# Patient Record
Sex: Female | Born: 1960
Health system: Southern US, Community
[De-identification: ages and names within clinical notes are randomized; demographics above are authoritative.]

## PROBLEM LIST (undated history)

## (undated) DIAGNOSIS — M7989 Other specified soft tissue disorders: Secondary | ICD-10-CM

## (undated) DIAGNOSIS — I10 Essential (primary) hypertension: Secondary | ICD-10-CM

## (undated) DIAGNOSIS — M2550 Pain in unspecified joint: Secondary | ICD-10-CM

## (undated) DIAGNOSIS — L719 Rosacea, unspecified: Secondary | ICD-10-CM

## (undated) DIAGNOSIS — G2581 Restless legs syndrome: Secondary | ICD-10-CM

## (undated) DIAGNOSIS — E039 Hypothyroidism, unspecified: Secondary | ICD-10-CM

## (undated) DIAGNOSIS — I83893 Varicose veins of bilateral lower extremities with other complications: Secondary | ICD-10-CM

## (undated) DIAGNOSIS — D649 Anemia, unspecified: Secondary | ICD-10-CM

## (undated) DIAGNOSIS — E538 Deficiency of other specified B group vitamins: Secondary | ICD-10-CM

## (undated) DIAGNOSIS — G4730 Sleep apnea, unspecified: Secondary | ICD-10-CM

## (undated) DIAGNOSIS — E559 Vitamin D deficiency, unspecified: Secondary | ICD-10-CM

## (undated) DIAGNOSIS — R0602 Shortness of breath: Secondary | ICD-10-CM

## (undated) DIAGNOSIS — K829 Disease of gallbladder, unspecified: Secondary | ICD-10-CM

## (undated) HISTORY — DX: Deficiency of other specified B group vitamins: E53.8

## (undated) HISTORY — DX: Restless legs syndrome: G25.81

## (undated) HISTORY — DX: Hypothyroidism, unspecified: E03.9

## (undated) HISTORY — DX: Morbid (severe) obesity due to excess calories: E66.01

## (undated) HISTORY — DX: Sleep apnea, unspecified: G47.30

## (undated) HISTORY — DX: Vitamin D deficiency, unspecified: E55.9

## (undated) HISTORY — DX: Other specified soft tissue disorders: M79.89

## (undated) HISTORY — DX: Rosacea, unspecified: L71.9

## (undated) HISTORY — PX: LASIK: SHX215

## (undated) HISTORY — DX: Disease of gallbladder, unspecified: K82.9

## (undated) HISTORY — DX: Essential (primary) hypertension: I10

## (undated) HISTORY — PX: CARPAL TUNNEL RELEASE: SHX101

## (undated) HISTORY — DX: Pain in unspecified joint: M25.50

## (undated) HISTORY — DX: Varicose veins of bilateral lower extremities with other complications: I83.893

## (undated) HISTORY — DX: Shortness of breath: R06.02

## (undated) HISTORY — DX: Anemia, unspecified: D64.9

## (undated) HISTORY — PX: EYE SURGERY: SHX253

---

## 2011-12-23 HISTORY — PX: GASTRIC BYPASS: SHX52

## 2014-12-22 HISTORY — PX: CHOLECYSTECTOMY: SHX55

## 2018-10-29 ENCOUNTER — Telehealth: Payer: Self-pay | Admitting: Family

## 2018-10-29 ENCOUNTER — Ambulatory Visit: Payer: Federal, State, Local not specified - PPO | Admitting: Family

## 2018-10-29 ENCOUNTER — Encounter: Payer: Self-pay | Admitting: Family

## 2018-10-29 VITALS — BP 118/74 | HR 61 | Temp 98.5°F | Ht 67.0 in | Wt 270.6 lb

## 2018-10-29 DIAGNOSIS — Z1159 Encounter for screening for other viral diseases: Secondary | ICD-10-CM

## 2018-10-29 DIAGNOSIS — G4733 Obstructive sleep apnea (adult) (pediatric): Secondary | ICD-10-CM

## 2018-10-29 DIAGNOSIS — G2581 Restless legs syndrome: Secondary | ICD-10-CM | POA: Insufficient documentation

## 2018-10-29 DIAGNOSIS — Z9884 Bariatric surgery status: Secondary | ICD-10-CM | POA: Insufficient documentation

## 2018-10-29 DIAGNOSIS — E538 Deficiency of other specified B group vitamins: Secondary | ICD-10-CM | POA: Insufficient documentation

## 2018-10-29 DIAGNOSIS — E559 Vitamin D deficiency, unspecified: Secondary | ICD-10-CM | POA: Insufficient documentation

## 2018-10-29 DIAGNOSIS — Z23 Encounter for immunization: Secondary | ICD-10-CM

## 2018-10-29 DIAGNOSIS — E039 Hypothyroidism, unspecified: Secondary | ICD-10-CM | POA: Diagnosis not present

## 2018-10-29 DIAGNOSIS — L719 Rosacea, unspecified: Secondary | ICD-10-CM | POA: Diagnosis not present

## 2018-10-29 DIAGNOSIS — D509 Iron deficiency anemia, unspecified: Secondary | ICD-10-CM | POA: Diagnosis not present

## 2018-10-29 DIAGNOSIS — I83813 Varicose veins of bilateral lower extremities with pain: Secondary | ICD-10-CM | POA: Insufficient documentation

## 2018-10-29 MED ORDER — ROPINIROLE HCL 1 MG PO TABS: 1.5000 mg | ORAL_TABLET | Freq: Every day | ORAL | 2 refills | Status: DC

## 2018-10-29 MED ORDER — METRONIDAZOLE 0.75 % EX GEL: 1.0000 "application " | Freq: Two times a day (BID) | CUTANEOUS | 4 refills | Status: DC

## 2018-10-29 MED ORDER — LEVOTHYROXINE SODIUM 25 MCG PO TABS: ORAL_TABLET | ORAL | 2 refills | Status: DC

## 2018-10-29 MED ORDER — LEVOTHYROXINE SODIUM 200 MCG PO TABS: ORAL_TABLET | ORAL | 1 refills | Status: DC

## 2018-10-29 NOTE — Telephone Encounter (Signed)
Thigh high will have the correct compression.  Full leg has less compression.  Advised [facility] in [place] to do thigh high.  Message left for patient on voice mail.

## 2018-10-29 NOTE — Telephone Encounter (Signed)
PT has called states that the rx she was given for compression stockings she took to [facility] and they need to know if Thigh high or full leg and what compression?   [facility]  Fax: [phone]

## 2018-10-29 NOTE — Telephone Encounter (Signed)
Please advise 

## 2018-10-29 NOTE — Progress Notes (Signed)
Subjective:    Patient ID: Stacy Salazar, female    DOB: 1961-11-04, 57 y.o.   MRN: [id number]  Chief Complaint  Patient presents with  . New Patient (Initial Visit)   PT presents to the office today to establish care. She states she just moved here from [place]. PT has a hx of gastric bypass in 2013. States she has gained lot of her weight back, but over the last two weeks has started the Keto diet and has lost 5 lbs.   She is also complaining of varicose veins bilaterally with pain. She states she has had these for years, but over the last month she has noticed an intermittent aching pain of 5 out 10.  Thyroid Problem  Presents for follow-up visit. Symptoms include dry skin, fatigue and weight gain. Patient reports no constipation, diarrhea or palpitations. The symptoms have been stable.  Anemia  Presents for follow-up visit. Symptoms include malaise/fatigue. There has been no anorexia, bruising/bleeding easily, leg swelling, palpitations or paresthesias.  RLS Pt currently taking Requip 1.5 mg nightly. States it was working well, but noticed her legs are slightly worse. Admits her caffeine intake has also picked up.  OSA Uses CPAP every night. Stable Rosacea  Uses metrogel as needed.    Review of Systems  Constitutional: Positive for fatigue, malaise/fatigue and weight gain.  Cardiovascular: Negative for palpitations.  Gastrointestinal: Negative for anorexia, constipation and diarrhea.  Neurological: Negative for paresthesias.  Hematological: Does not bruise/bleed easily.  All other systems reviewed and are negative.  Family History  Problem Relation Age of Onset  . Alcohol abuse Mother   . Cancer Mother   . Cirrhosis Mother   . Heart disease Father    Social History   Socioeconomic History  . Marital status: Divorced    Spouse name: Not on file  . Number of children: Not on file  . Years of education: Not on file  . Highest education level: Not on file  Occupational  History  . Not on file  Social Needs  . Financial resource strain: Not on file  . Food insecurity:    Worry: Not on file    Inability: Not on file  . Transportation needs:    Medical: Not on file    Non-medical: Not on file  Tobacco Use  . Smoking status: Never Smoker  . Smokeless tobacco: Never Used  Substance and Sexual Activity  . Alcohol use: Not Currently    Comment: Glass of wine monthly  . Drug use: Never  . Sexual activity: Not Currently  Lifestyle  . Physical activity:    Days per week: Not on file    Minutes per session: Not on file  . Stress: Not on file  Relationships  . Social connections:    Talks on phone: Not on file    Gets together: Not on file    Attends religious service: Not on file    Active member of club or organization: Not on file    Attends meetings of clubs or organizations: Not on file    Relationship status: Not on file  Other Topics Concern  . Not on file  Social History Narrative  . Not on file       Objective:   Physical Exam  Constitutional: She is oriented to person, place, and time. She appears well-developed and well-nourished. No distress.  Morbid obese  HENT:  Head: Normocephalic and atraumatic.  Right Ear: External ear normal.  Left Ear: External ear  normal.  Mouth/Throat: Oropharynx is clear and moist.  Eyes: Pupils are equal, round, and reactive to light.  Neck: Normal range of motion. Neck supple. No thyromegaly present.  Cardiovascular: Normal rate, regular rhythm, normal heart sounds and intact distal pulses.  No murmur heard. Pulmonary/Chest: Effort normal and breath sounds normal. No respiratory distress. She has no wheezes.  Abdominal: Soft. Bowel sounds are normal. She exhibits no distension. There is no tenderness.  Musculoskeletal: Normal range of motion. She exhibits no edema or tenderness.  Neurological: She is alert and oriented to person, place, and time. She has normal reflexes. No cranial nerve deficit.    Skin: Skin is warm and dry.     varicose veins present   Psychiatric: She has a normal mood and affect. Her behavior is normal. Judgment and thought content normal.  Vitals reviewed.     BP 118/74   Pulse 61   Temp 98.5 F (36.9 C) (Oral)   Ht '5\' 7"'  (1.702 m)   Wt 270 lb 9.6 oz (122.7 kg)   BMI 42.38 kg/m      Assessment & Plan:  Stacy Salazar comes in today with chief complaint of New Patient (Initial Visit)   Diagnosis and orders addressed:  1. Hypothyroidism, unspecified type - CMP14+EGFR - TSH - levothyroxine (SYNTHROID, LEVOTHROID) 200 MCG tablet; TAKE 1 TABLET BY MOUTH ONCE DAILY IN THE MORNING ON AN EMPTY STOMACH  Dispense: 90 tablet; Refill: 1 - levothyroxine (SYNTHROID, LEVOTHROID) 25 MCG tablet; TAKE 1 TABLET BY MOUTH ONCE DAILY BEFORE BREAKFAST  Dispense: 90 tablet; Refill: 2  2. Iron deficiency anemia, unspecified iron deficiency anemia type - Ferrous Sulfate (IRON) 325 (65 Fe) MG TABS; Take by mouth. - CMP14+EGFR - Anemia Profile B  3. Rosacea Avoid extreme hot or cold temps Alcohol  - CMP14+EGFR - metroNIDAZOLE (METROGEL) 0.75 % gel; Apply 1 application topically 2 (two) times daily.  Dispense: 45 g; Refill: 4  4. Hx of gastric bypass - CMP14+EGFR - Lipid panel - VITAMIN D 25 Hydroxy (Vit-D Deficiency, Fractures)  5. Restless leg syndrome - CMP14+EGFR - rOPINIRole (REQUIP) 1 MG tablet; Take 1.5 tablets (1.5 mg total) by mouth at bedtime.  Dispense: 135 tablet; Refill: 2  6. Morbid obesity ([place]) - CMP14+EGFR - Lipid panel  7. OSA (obstructive sleep apnea) - CMP14+EGFR - Lipid panel  8. Varicose veins of bilateral lower extremities with pain - CMP14+EGFR - Lipid panel - Compression stockings  9. Need for hepatitis C screening test - CMP14+EGFR - Hepatitis C antibody  10. Vitamin B 12 deficiency - CALCIUM CITRATE PO; Take 600 mg by mouth 3 (three) times daily. - CMP14+EGFR - Anemia Profile B  11. Vitamin D deficiency -  Cholecalciferol (VITAMIN D) 50 MCG (2000 UT) CAPS; Take by mouth. - CMP14+EGFR - VITAMIN D 25 Hydroxy (Vit-D Deficiency, Fractures)   Labs pending Health Maintenance reviewed- Mammogram and Colonoscopy completed and will get copy scanned into chart Diet and exercise encouraged  Follow up plan: 6 months    [name], FNP

## 2018-10-29 NOTE — Telephone Encounter (Signed)
15-20 mmHg, and please give full leg. Thanks

## 2018-10-29 NOTE — Patient Instructions (Addendum)
Rosacea Rosacea is a long-term (chronic) condition that affects the skin of the face, including the cheeks, nose, brow, and chin. This condition can also affect the eyes. Rosacea causes blood vessels near the surface of the skin to enlarge, which results in redness. What are the causes? The cause of this condition is not known. Certain triggers can make rosacea worse, including:  Hot baths.  Exercise.  Sunlight.  Very hot or cold temperatures.  Hot or spicy foods and drinks.  Drinking alcohol.  Stress.  Taking blood pressure medicine.  Long-term use of topical steroids on the face.  What increases the risk? This condition is more likely to develop in:  People who are older than 57 years of age.  Women.  People who have light-colored skin (light complexion).  People who have a family history of rosacea.  What are the signs or symptoms? Symptoms of this condition include:  Redness of the face.  Red bumps or pimples on the face.  A red, enlarged nose.  Blushing easily.  Red lines on the skin.  Irritated or burning feeling in the eyes.  Swollen eyelids.  How is this diagnosed? This condition is diagnosed with a medical history and physical exam. How is this treated? There is no cure for this condition, but treatment can help to control your symptoms. Your health care provider may recommend that you see a skin specialist (dermatologist). Treatment may include:  Antibiotic medicines that are applied to the skin or taken as a pill.  Laser treatment to improve the appearance of the skin.  Surgery. This is rare.  Your health care provider will also recommend the best way to take care of your skin. Even after your skin improves, you will likely need to continue treatment to prevent your rosacea from coming back. Follow these instructions at home: Skin Care Take care of your skin as told by your health care provider. You may be told to do these things:  Wash  your skin gently two or more times each day.  Use mild soap.  Use a sunscreen or sunblock with SPF 30 or greater.  Use gentle cosmetics that are meant for sensitive skin.  Shave with an electric shaver instead of a blade.  Lifestyle  Try to keep track of what foods trigger this condition. Avoid any triggers. These may include: ? Spicy foods. ? Seafood. ? Cheese. ? Hot liquids. ? Nuts. ? Chocolate. ? Iodized salt.  Do not drink alcohol.  Avoid extremely cold or hot temperatures.  Try to reduce your stress. If you need help, talk with your health care provider.  When you exercise, do these things to stay cool: ? Limit your sun exposure. ? Use a fan. ? Do shorter and more frequent intervals of exercise. General instructions  Keep all follow-up visits as told by your health care provider. This is important.  Take over-the-counter and prescription medicines only as told by your health care provider.  If your eyelids are affected, apply warm compresses to them. Do this as told by your health care provider.  If you were prescribed an antibiotic medicine, apply or take it as told by your health care provider. Do not stop using the antibiotic even if your condition improves. Contact a health care provider if:  Your symptoms get worse.  Your symptoms do not improve after two months of treatment.  You have new symptoms.  You have any changes in vision or you have problems with your eyes, such as   redness or itching.  You feel depressed.  You lose your appetite.  You have trouble concentrating. This information is not intended to replace advice given to you by your health care provider. Make sure you discuss any questions you have with your health care provider. Document Released: 01/15/2005 Document Revised: 05/15/2016 Document Reviewed: 02/14/2015 Elsevier Interactive Patient Education  2018 Elsevier Inc. Restless Legs Syndrome Restless legs syndrome is a condition  that causes uncomfortable feelings or sensations in the legs, especially while sitting or lying down. The sensations usually cause an overwhelming urge to move the legs. The arms can also sometimes be affected. The condition can range from mild to severe. The symptoms often interfere with a person's ability to sleep. What are the causes? The cause of this condition is not known. What increases the risk? This condition is more likely to develop in:  People who are older than age 66.  Pregnant women. In general, restless legs syndrome is more common in women than in men.  People who have a family history of the condition.  People who have certain medical conditions, such as iron deficiency, kidney disease, Parkinson disease, or nerve damage.  People who take certain medicines, such as medicines for high blood pressure, nausea, colds, allergies, depression, and some heart conditions.  What are the signs or symptoms? The main symptom of this condition is uncomfortable sensations in the legs. These sensations may be:  Described as pulling, tingling, prickling, throbbing, crawling, or burning.  Worse while you are sitting or lying down.  Worse during periods of rest or inactivity.  Worse at night, often interfering with your sleep.  Accompanied by a very strong urge to move your legs.  Temporarily relieved by movement of your legs.  The sensations usually affect both sides of the body. The arms can also be affected, but this is rare. People who have this condition often have tiredness during the day because of their lack of sleep at night. How is this diagnosed? This condition may be diagnosed based on your description of the symptoms. You may also have tests, including blood tests, to check for other conditions that may lead to your symptoms. In some cases, you may be asked to spend some time in a sleep lab so your sleeping can be monitored. How is this treated? Treatment for this  condition is focused on managing the symptoms. Treatment may include:  Self-help and lifestyle changes.  Medicines.  Follow these instructions at home:  Take medicines only as directed by your health care provider.  Try these methods to get temporary relief from the uncomfortable sensations: ? Massage your legs. ? Walk or stretch. ? Take a cold or hot bath.  Practice good sleep habits. For example, go to bed and get up at the same time every day.  Exercise regularly.  Practice ways of relaxing, such as yoga or meditation.  Avoid caffeine and alcohol.  Do not use any tobacco products, including cigarettes, chewing tobacco, or electronic cigarettes. If you need help quitting, ask your health care provider.  Keep all follow-up visits as directed by your health care provider. This is important. Contact a health care provider if: Your symptoms do not improve with treatment, or they get worse. This information is not intended to replace advice given to you by your health care provider. Make sure you discuss any questions you have with your health care provider. Document Released: 11/28/2002 Document Revised: 05/15/2016 Document Reviewed: 12/04/2014 Elsevier Interactive Patient Education  2018  [employer].

## 2018-10-30 LAB — CMP14+EGFR
ALT: 17 IU/L (ref 0–32)
AST: 18 IU/L (ref 0–40)
Albumin/Globulin Ratio: 1.6 (ref 1.2–2.2)
Albumin: 4.3 g/dL (ref 3.5–5.5)
Alkaline Phosphatase: 83 IU/L (ref 39–117)
BUN/Creatinine Ratio: 25 — ABNORMAL HIGH (ref 9–23)
BUN: 15 mg/dL (ref 6–24)
Bilirubin Total: 0.3 mg/dL (ref 0.0–1.2)
CALCIUM: 9.6 mg/dL (ref 8.7–10.2)
CO2: 25 mmol/L (ref 20–29)
Chloride: 102 mmol/L (ref 96–106)
Creatinine, Ser: 0.61 mg/dL (ref 0.57–1.00)
GFR calc Af Amer: 116 mL/min/{1.73_m2} (ref >59)
GFR, EST NON AFRICAN AMERICAN: 101 mL/min/{1.73_m2} (ref >59)
Globulin, Total: 2.7 g/dL (ref 1.5–4.5)
Glucose: 89 mg/dL (ref 65–99)
POTASSIUM: 4.9 mmol/L (ref 3.5–5.2)
SODIUM: 141 mmol/L (ref 134–144)
TOTAL PROTEIN: 7 g/dL (ref 6.0–8.5)

## 2018-10-30 LAB — ANEMIA PROFILE B
BASOS ABS: 0 10*3/uL (ref 0.0–0.2)
Basos: 1 %
EOS (ABSOLUTE): 0.1 10*3/uL (ref 0.0–0.4)
Eos: 2 %
Ferritin: 96 ng/mL (ref 15–150)
Folate: >20 ng/mL (ref >3.0)
HEMATOCRIT: 36 % (ref 34.0–46.6)
Hemoglobin: 12.5 g/dL (ref 11.1–15.9)
IMMATURE GRANS (ABS): 0 10*3/uL (ref 0.0–0.1)
IMMATURE GRANULOCYTES: 0 %
Iron Saturation: 33 % (ref 15–55)
Iron: 103 ug/dL (ref 27–159)
LYMPHS: 42 %
Lymphocytes Absolute: 2.5 10*3/uL (ref 0.7–3.1)
MCH: 30.6 pg (ref 26.6–33.0)
MCHC: 34.7 g/dL (ref 31.5–35.7)
MCV: 88 fL (ref 79–97)
MONOCYTES: 7 %
Monocytes Absolute: 0.4 10*3/uL (ref 0.1–0.9)
NEUTROS PCT: 48 %
Neutrophils Absolute: 2.9 10*3/uL (ref 1.4–7.0)
Platelets: 301 10*3/uL (ref 150–450)
RBC: 4.08 x10E6/uL (ref 3.77–5.28)
RDW: 12.5 % (ref 12.3–15.4)
RETIC CT PCT: 1.3 % (ref 0.6–2.6)
Total Iron Binding Capacity: 309 ug/dL (ref 250–450)
UIBC: 206 ug/dL (ref 131–425)
Vitamin B-12: >2000 pg/mL — ABNORMAL HIGH (ref 232–1245)
WBC: 6 10*3/uL (ref 3.4–10.8)

## 2018-10-30 LAB — HEPATITIS C ANTIBODY

## 2018-10-30 LAB — LIPID PANEL
CHOL/HDL RATIO: 3.3 ratio (ref 0.0–4.4)
Cholesterol, Total: 173 mg/dL (ref 100–199)
HDL: 53 mg/dL (ref >39)
LDL Calculated: 82 mg/dL (ref 0–99)
TRIGLYCERIDES: 192 mg/dL — AB (ref 0–149)
VLDL Cholesterol Cal: 38 mg/dL (ref 5–40)

## 2018-10-30 LAB — TSH: TSH: 2.45 u[IU]/mL (ref 0.450–4.500)

## 2018-10-30 LAB — VITAMIN D 25 HYDROXY (VIT D DEFICIENCY, FRACTURES): VIT D 25 HYDROXY: 31.7 ng/mL (ref 30.0–100.0)

## 2018-12-30 ENCOUNTER — Other Ambulatory Visit: Payer: Self-pay | Admitting: Family

## 2018-12-30 DIAGNOSIS — Z9884 Bariatric surgery status: Secondary | ICD-10-CM

## 2019-01-10 ENCOUNTER — Encounter: Payer: Federal, State, Local not specified - PPO | Attending: Family | Admitting: Skilled Nursing Facility1

## 2019-01-10 ENCOUNTER — Encounter: Payer: Self-pay | Admitting: Skilled Nursing Facility1

## 2019-01-10 NOTE — Progress Notes (Signed)
Follow-up visit: Post-Operative RYGB 2013 Surgery  Primary concerns today: Post-operative Bariatric Surgery Nutrition Management.   Pt states she lost 100 pounds down to 237 pounds and states she could not lose any more weight stating she has gained 40 pounds and has tried several different diets since then. Pt states she has retired from the [employer]. Pt states her only concern is to lose weight. Pt states she has felt tired and sluggish. Pt state she joined [employer]. Pt states she wears her C-PAP every night.  Pt arrives with very thin hair (stating that is her normal) and red splotches on her face (stating she has roscea).  Pt states she has been struggling with forgetfulness.   TANITA  BODY COMP RESULTS  01/10/2019   BMI (kg/m^2) 43.4   Fat Mass (lbs) 136.2   Fat Free Mass (lbs) 140.6   Total Body Water (lbs) 103.2/37.3%   24-hr recall: B (AM): 1 cup of oatmeal with 1/2 cup blueberries and 1/4 walnuts and 2 scoops of vanilla greek yogurt  Snk (AM):  1/2 cup cottage cheese and sometimes with cucumber or blueberries  L (PM): cabbage soup with ground [place]key  Snk (PM): cottage cheese with cucumber or blueberries  D (PM): pork or chicken with lima beans or brussels sprouts of green beans  Snk (PM): toast and peanutbutter   Fluid intake: 3 cups (24 ounces) of coffee with truvia and 2% milk, 24 ounces decaf green tea with truvia, 16 ounces sugar free ice tea mix with water Sometimes 3-4 times a week 5 ounces of red wine Estimated total protein intake: 80+  Medications: see list Supplementation: calcium citrate 2 times a day and vitmain b12 once a day 1000mcg, centrum silver   Using straws: no Drinking while eating: yes Having you been chewing well: yes Chewing/swallowing difficulties: no Changes in vision: no Changes to mood/headaches: no Hair loss/Changes to skin/Changes to nails: no Any difficulty focusing or concentrating: yes Sweating: no Dizziness/Lightheaded:  no Palpitations: no Carbonated beverages: no N/V/D/C/GAS: no Abdominal Pain: no Dumping syndrome: no  Recent physical activity:  Walking 6 days a week 20-30 minutes; 30 minutes on the treadmill and 30 minutes doing weight 3 days a week   Progress Towards Goal(s):  In progress.    Intervention:  Nutrition cousneling. Due to the bodies need for essential vitamins, minerals, and fats the pt was educated on the need to consume a certain amount of calories as well as certain nutrients daily. Pt was educated on the need for daily physical activity and to reach a goal of at least 150 minutes of moderate to vigorous physical activity as directed by their physician due to such benefits as increased musculature and improved lab values.   Goals: -Stop taking the vitamin B12 supplement  -Start taking the appropriate multivitamin -Log every thing you eat and drink for the next month  -Do not use butter to cook with    -Do not use cream cheese, butter, sugar, honey, etc.  -Cut your coffee down to 2 cups of caffienated a day -Aim for a minimum of 64 ounces of decaff green tea and unsweet tea    Teaching Method Utilized:  Visual Auditory Hands on  Barriers to learning/adherence to lifestyle change: none stated   Demonstrated degree of understanding via:  Teach Back   Monitoring/Evaluation:  Dietary intake, exercise, and body weight. Follow up in 1 months

## 2019-01-10 NOTE — Patient Instructions (Addendum)
-  Stop taking the vitamin B12 supplement   -Start taking the appropriate multivitamin  -Log every thing you eat and drink for the next month   -Do not use butter to cook with     -Do not use cream cheese, butter, sugar, honey, etc.   -Cut your coffee down to 2 cups of caffienated a day  -Aim for a minimum of 64 ounces of decaff green tea and unsweet tea

## 2019-01-11 ENCOUNTER — Telehealth: Payer: Self-pay | Admitting: Skilled Nursing Facility1

## 2019-01-11 NOTE — Telephone Encounter (Signed)
1) Yes 2) It is possible to overconsume biotin so I do not recommend an extra supplement of it. The appropriate multivitamin we went over does have the adequate amount of biotin in it.  Hope this helps!  From: [email or web address]  Sent: Monday, January 10, 2019 9:42 PM To: Scotece, [email or web address]> Subject: [External Email]Yesterdays Appointment (01/08/2019) with Stacy Salazar  *Caution - External email - see footer for warnings* Good Morning Stacy Salazar  This is Stacy Salazar. I was in for an appointment yesterday at 815 AM for bariatric/nutrition questions.  Two questions I forgot to ask you were:       1) If I drink decaf coffee can I add that amount into my 64 oz of fluid along with the decaf tea and unsweet tea ?       2) What is your recommendation about Hair, Skin, and Nails with Biotin for my thinning hair ?  In your experience, do you feel  these actually work or is this just another hype ?  Thank you for all your help yesterday !   Stacy Salazar WARNING: This email originated outside of [facility]. Even if this looks like a [employer], it is not. Do not provide your username, password, or any other personal information in response to this or any other email. [facility] will never ask you for your username or password via email. DO NOT CLICK links or attachments unless you are positive the content is safe. If in doubt about the safety of this message, select the [employer] button, which forwards to IT Security.

## 2019-02-08 ENCOUNTER — Ambulatory Visit: Payer: Federal, State, Local not specified - PPO | Admitting: Family

## 2019-02-10 ENCOUNTER — Encounter: Payer: Federal, State, Local not specified - PPO | Attending: Family | Admitting: Skilled Nursing Facility1

## 2019-02-10 NOTE — Patient Instructions (Addendum)
-  Do not weigh every day; only weigh once a week  -Great Job!!!  -Aim for 80 ounces of fluid a day  -Take your multivitamin and calcium at least 2 hours a part

## 2019-02-10 NOTE — Progress Notes (Signed)
Follow-up visit: Post-Operative RYGB 2013 Surgery  Primary concerns today: Post-operative Bariatric Surgery Nutrition Management.  Pt states she wears her C-PAP every night.  Pt arrives with very thin hair (stating that is her normal) and red splotches on her face (stating she has roscea).  Pt states she has been struggling with forgetfulness.   Pt states her goals have been to workout 3 times a week and walking every day and feels she has achieved that goal. Pt states when she went out of town she brought snacks with per. Pt state she feels better since her last visits with more energy.   Weight: 268 pounds Weight change: -8 pounds   TANITA  BODY COMP RESULTS  01/10/2019 02/10/2019   BMI (kg/m^2) 43.4 42   Fat Mass (lbs) 136.2 130.8   Fat Free Mass (lbs) 140.6 137.2   Total Body Water (lbs) 103.2/37.3% 100.6   24-hr recall: B (AM): 2 eggs, 1 slice cheese, onion, 1 slice bread with 1 tsp peanuttbutter  Snk (AM):  Low fat greek yogurt and strawberries L (PM): half cucumber, 6 radishes, roasted cauliflower, 1 egg, red pepper, and pinto beans Snk (PM): walnuts and 1 slice of bread  D (PM): beef stew and pickeled beets  Snk (PM): toast and peanutbutter   Fluid intake: 3 cups (24 ounces) of coffee with truvia and 2% milk, 24 ounces decaf green tea with truvia, 16 ounces sugar free ice tea mix with water: 64+ fluid ounces  Sometimes 3-4 times a week 5 ounces of red wine Estimated total protein intake: 80+  Medications: see list Supplementation: bar advantage 2 and calcium   Using straws: no Drinking while eating: yes Having you been chewing well: yes Chewing/swallowing difficulties: no Changes in vision: no Changes to mood/headaches: no Hair loss/Changes to skin/Changes to nails: no Any difficulty focusing or concentrating: yes Sweating: no Dizziness/Lightheaded: no Palpitations: no Carbonated beverages: no N/V/D/C/GAS: no Abdominal Pain: no Dumping syndrome: no  Recent  physical activity:  Walking 6 days a week 20-30 minutes; 30 minutes on the treadmill and 30 minutes doing weight 3 days a week   Progress Towards Goal(s):  In progress.    Intervention:  Nutrition cousneling. Due to the bodies need for essential vitamins, minerals, and fats the pt was educated on the need to consume a certain amount of calories as well as certain nutrients daily. Pt was educated on the need for daily physical activity and to reach a goal of at least 150 minutes of moderate to vigorous physical activity as directed by their physician due to such benefits as increased musculature and improved lab values.   Goals: -Do not weigh every day; only weigh once a week -Great Job!!! -Aim for 80 ounces of fluid a day -Take your multivitamin and calcium at least 2 hours a part   Teaching Method Utilized:  Visual Auditory Hands on  Barriers to learning/adherence to lifestyle change: none stated   Demonstrated degree of understanding via:  Teach Back   Monitoring/Evaluation:  Dietary intake, exercise, and body weight. Follow up in 1 months

## 2019-02-11 ENCOUNTER — Encounter: Payer: Self-pay | Admitting: Family

## 2019-02-11 ENCOUNTER — Ambulatory Visit: Payer: Federal, State, Local not specified - PPO | Admitting: Family

## 2019-02-11 VITALS — BP 130/77 | HR 61 | Temp 97.9°F | Ht 67.0 in | Wt 269.6 lb

## 2019-02-11 DIAGNOSIS — L719 Rosacea, unspecified: Secondary | ICD-10-CM | POA: Diagnosis not present

## 2019-02-11 DIAGNOSIS — I83813 Varicose veins of bilateral lower extremities with pain: Secondary | ICD-10-CM | POA: Diagnosis not present

## 2019-02-11 MED ORDER — DOXYCYCLINE HYCLATE 100 MG PO TABS: 100.0000 mg | ORAL_TABLET | Freq: Every day | ORAL | 3 refills | Status: DC

## 2019-02-11 MED ORDER — METRONIDAZOLE 0.75 % EX GEL: 1.0000 "application " | Freq: Two times a day (BID) | CUTANEOUS | 4 refills | Status: DC

## 2019-02-11 NOTE — Patient Instructions (Signed)
Varicose Veins Varicose veins are veins that have become enlarged, bulged, and twisted. They most often appear in the legs. What are the causes? This condition is caused by damage to the valves in the vein. These valves help blood return to your heart. When they are damaged and they stop working properly, blood may flow backward and back up in the veins near the skin, causing the veins to get larger and appear twisted. The condition can result from any issue that causes blood to back up, like pregnancy, prolonged standing, or obesity. What increases the risk? This condition is more likely to develop in people who are:  On their feet a lot.  Pregnant.  Overweight. What are the signs or symptoms? Symptoms of this condition include:  Bulging, twisted, and bluish veins.  A feeling of heaviness. This may be worse at the end of the day.  Leg pain. This may be worse at the end of the day.  Swelling in the leg.  Changes in skin color over the veins. How is this diagnosed? This condition may be diagnosed based on your symptoms, a physical exam, and an ultrasound test. How is this treated? Treatment for this condition may involve:  Avoiding sitting or standing in one position for long periods of time.  Wearing compression stockings. These stockings help to prevent blood clots and reduce swelling in the legs.  Raising (elevating) the legs when resting.  Losing weight.  Exercising regularly. If you have persistent symptoms or want to improve the way your varicose veins look, you may choose to have a procedure to close the varicose veins off or to remove them. Treatments to close off the veins include:  Sclerotherapy. In this treatment, a solution is injected into a vein to close it off.  Laser treatment. In this treatment, the vein is heated with a laser to close it off.  Radiofrequency vein ablation. In this treatment, an electrical current produced by radio waves is used to close  off the vein. Treatments to remove the veins include:  Phlebectomy. In this treatment, the veins are removed through small incisions made over the veins.  Vein ligation and stripping. In this treatment, incisions are made over the veins. The veins are then removed after being tied (ligated) with stitches (sutures). Follow these instructions at home: Activity  Walk as much as possible. Walking increases blood flow. This helps blood return to the heart and takes pressure off your veins. It also increases your cardiovascular strength.  Follow your health care provider's instructions about exercising.  Do not stand or sit in one position for a long period of time.  Do not sit with your legs crossed.  Rest with your legs raised during the day. General instructions   Follow any diet instructions given to you by your health care provider.  Wear compression stockings as directed by your health care provider. Do not wear other kinds of tight clothing around your legs, pelvis, or waist.  Elevate your legs at night to above the level of your heart.  If you get a cut in the skin over the varicose vein and the vein bleeds: ? Lie down with your leg raised. ? Apply firm pressure to the cut with a clean cloth until the bleeding stops. ? Place a bandage (dressing) on the cut. Contact a health care provider if:  The skin around your varicose veins starts to break down.  You have pain, redness, tenderness, or hard swelling over a vein.  You   are uncomfortable because of pain.  You get a cut in the skin over a varicose vein and it will not stop bleeding. Summary  Varicose veins are veins that have become enlarged, bulged, and twisted. They most often appear in the legs.  This condition is caused by damage to the valves in the vein. These valves help blood return to your heart.  Treatment for this condition includes frequent movements, wearing compression stockings, losing weight, and  exercising regularly. In some cases, procedures are done to close off or remove the veins.  Treatment for this condition may include wearing compression stockings, elevating the legs, losing weight, and engaging in regular activity. In some cases, procedures are done to close off or remove the veins. This information is not intended to replace advice given to you by your health care provider. Make sure you discuss any questions you have with your health care provider. Document Released: 09/17/2005 Document Revised: 12/31/2016 Document Reviewed: 12/31/2016 Elsevier Interactive Patient Education  2019 [employer] Inc[employer]  

## 2019-02-11 NOTE — Progress Notes (Signed)
Subjective:    Patient ID: Stacy Salazar, female    DOB: 1961-01-29, 58 y.o.   MRN: [id number]  Chief Complaint  Patient presents with  . painful varicose veins    HPI PT presents to the office today with bilateral posterior knee pain from varicose veins. She has wore compression hose for the last 3 months with no relief. She states the pain is becoming worse and waking her up at night. She reports intermittent throbbing pain of 7 out 10 that is worse in her left thigh.   She states sitting for long periods of time makes the pain worse. She states she has had  Hx of vein striping in her lower legs in the 90's.    She also has rosacea and states her face is flared up over the last few months. She has not used her metrogel because she was unsure if she could continue that daily.   Review of Systems  All other systems reviewed and are negative.      Objective:   Physical Exam Vitals signs reviewed.  Constitutional:      General: She is not in acute distress.    Appearance: She is well-developed.  HENT:     Head: Normocephalic and atraumatic.     Right Ear: Tympanic membrane normal.     Left Ear: Tympanic membrane normal.  Eyes:     Pupils: Pupils are equal, round, and reactive to light.  Neck:     Musculoskeletal: Normal range of motion and neck supple.     Thyroid: No thyromegaly.  Cardiovascular:     Rate and Rhythm: Normal rate and regular rhythm.     Heart sounds: Normal heart sounds. No murmur.  Pulmonary:     Effort: Pulmonary effort is normal. No respiratory distress.     Breath sounds: Normal breath sounds. No wheezing.  Abdominal:     General: Bowel sounds are normal. There is no distension.     Palpations: Abdomen is soft.     Tenderness: There is no abdominal tenderness.  Musculoskeletal: Normal range of motion.        General: No tenderness.  Skin:    General: Skin is warm and dry.  Neurological:     Mental Status: She is alert and oriented to person,  place, and time.     Cranial Nerves: No cranial nerve deficit.     Deep Tendon Reflexes: Reflexes are normal and symmetric.  Psychiatric:        Behavior: Behavior normal.        Thought Content: Thought content normal.        Judgment: Judgment normal.       BP 130/77   Pulse 61   Temp 97.9 F (36.6 C) (Oral)   Ht 5\' 7"  (1.702 m)   Wt 269 lb 9.6 oz (122.3 kg)   BMI 42.23 kg/m      Assessment & Plan:  Stacy Salazar comes in today with chief complaint of painful varicose veins   Diagnosis and orders addressed:  1. Rosacea Avoid caffeine alcohol Start doxycycline daily and restart metrogel - metroNIDAZOLE (METROGEL) 0.75 % gel; Apply 1 application topically 2 (two) times daily.  Dispense: 45 g; Refill: 4 - doxycycline (VIBRA-TABS) 100 MG tablet; Take 1 tablet (100 mg total) by mouth daily.  Dispense: 30 tablet; Refill: 3  2. Varicose veins of both lower extremities with pain Continue compression hose  - Ambulatory referral to Vascular Surgery  [name], FNP

## 2019-03-14 ENCOUNTER — Other Ambulatory Visit: Payer: Self-pay | Admitting: Family

## 2019-03-14 DIAGNOSIS — G2581 Restless legs syndrome: Secondary | ICD-10-CM

## 2019-03-14 MED ORDER — ROPINIROLE HCL 1 MG PO TABS: 3.0000 mg | ORAL_TABLET | Freq: Every day | ORAL | 2 refills | Status: DC

## 2019-03-15 ENCOUNTER — Encounter: Payer: Federal, State, Local not specified - PPO | Attending: Family | Admitting: Skilled Nursing Facility1

## 2019-03-15 ENCOUNTER — Other Ambulatory Visit: Payer: Self-pay

## 2019-03-15 NOTE — Progress Notes (Signed)
Follow-up visit: Post-Operative RYGB 2013 Surgery  Primary concerns today: Post-operative Bariatric Surgery Nutrition Management.  Pt states she wears her C-PAP every night.  Pt arrives with very thin hair (stating that is her normal) and red splotches on her face (stating she has roscea).  Pt states she has been struggling with forgetfulness.   Pt states she has been stressed with moving her daughter from [place]. Pt states she has been drinking 80 ounces of fluid which has increased her TBW. Pt states she found her old DVD hip hop abs so she will start those.   Weight: 269 pounds Weight change: +1 pounds  Body Composition Scale  Total Body Fat: 46.5 %  Visceral Fat: 18  Fat-Free Mass:  53.4 %   Total Body Water: 41.2  %  Muscle-Mass: 32.5 lbs  Body Fat Displacement:  Torso: 77.7  lbs Left Leg: 15.5  lbs Right Leg: 15.5 lbs Left Arm: 7.7 lbs Right Arm: 7.7 lbs  24-hr recall: B (AM): 2 eggs, 1 slice cheese, onion, 1 slice bread with 1 tsp peanuttbutter and fruit  Snk (AM):  1/2 Low fat greek yogurt and strawberries and 1/4 walnuts L (PM): chicken and diced tomatoes with frozen veggies or mashed cauliflower Snk (PM): 1/2 cup 2% cottage cheese and fruit D (PM): 4 ounces of chicken or pork loin with non starchy vegetables  Snk (PM): toast and peanutbutter or 1/4cup yogurt and fruit  Fluid intake: 80 ounces: 5 cal juice diluted with water, decaf coffee and tea; 1-2 times a week 5 ounces of wine Estimated total protein intake: 80+  Medications: see list Supplementation: bari advantage 2 and calcium   Using straws: no Drinking while eating: yes Having you been chewing well: yes Chewing/swallowing difficulties: no Changes in vision: no Changes to mood/headaches: no Hair loss/Changes to skin/Changes to nails: no Any difficulty focusing or concentrating: yes Sweating: no Dizziness/Lightheaded: no Palpitations: no Carbonated beverages: no N/V/D/C/GAS: no Abdominal  Pain: no Dumping syndrome: no  Recent physical activity:  Walking 6 days a week 20-30 minutes  Progress Towards Goal(s):  In progress.    Intervention:  Nutrition cousneling. Due to the bodies need for essential vitamins, minerals, and fats the pt was educated on the need to consume a certain amount of calories as well as certain nutrients daily. Pt was educated on the need for daily physical activity and to reach a goal of at least 150 minutes of moderate to vigorous physical activity as directed by their physician due to such benefits as increased musculature and improved lab values.   Goals: -Look into YouTube your own body weight for resistance  -Continue to take your multivitamin and calcium  -Cut out the wine and cut your snack back a little  -Do not increase your calories to accomodate the hunger you feel when you start working out   Teaching Method Utilized:  Visual Auditory Hands on  Barriers to learning/adherence to lifestyle change: none stated   Demonstrated degree of understanding via:  Teach Back   Monitoring/Evaluation:  Dietary intake, exercise, and body weight. Follow up in 2 months

## 2019-03-15 NOTE — Patient Instructions (Addendum)
-  Look into YouTube your own body weight for resistance   -Continue to take your multivitamin and calcium   -Cut out the wine and cut your snack back a little   -Do not increase your calories to accomodate the hunger you feel when you start working out

## 2019-04-04 ENCOUNTER — Encounter (HOSPITAL_COMMUNITY): Payer: Federal, State, Local not specified - PPO

## 2019-04-04 ENCOUNTER — Encounter: Payer: Federal, State, Local not specified - PPO | Admitting: Surgery

## 2019-04-29 ENCOUNTER — Other Ambulatory Visit: Payer: Self-pay

## 2019-04-29 ENCOUNTER — Ambulatory Visit: Payer: Federal, State, Local not specified - PPO | Admitting: Family

## 2019-05-02 ENCOUNTER — Ambulatory Visit: Payer: Federal, State, Local not specified - PPO | Admitting: Family

## 2019-05-02 ENCOUNTER — Other Ambulatory Visit: Payer: Self-pay

## 2019-05-02 ENCOUNTER — Encounter: Payer: Self-pay | Admitting: Family

## 2019-05-02 VITALS — BP 136/81 | HR 58 | Temp 97.9°F | Ht 67.0 in | Wt 273.4 lb

## 2019-05-02 DIAGNOSIS — Z23 Encounter for immunization: Secondary | ICD-10-CM

## 2019-05-02 DIAGNOSIS — D509 Iron deficiency anemia, unspecified: Secondary | ICD-10-CM | POA: Diagnosis not present

## 2019-05-02 DIAGNOSIS — G2581 Restless legs syndrome: Secondary | ICD-10-CM

## 2019-05-02 DIAGNOSIS — E039 Hypothyroidism, unspecified: Secondary | ICD-10-CM | POA: Diagnosis not present

## 2019-05-02 DIAGNOSIS — L719 Rosacea, unspecified: Secondary | ICD-10-CM

## 2019-05-02 DIAGNOSIS — H6123 Impacted cerumen, bilateral: Secondary | ICD-10-CM

## 2019-05-02 DIAGNOSIS — G4733 Obstructive sleep apnea (adult) (pediatric): Secondary | ICD-10-CM | POA: Diagnosis not present

## 2019-05-02 MED ORDER — DOXYCYCLINE HYCLATE 100 MG PO TABS: 100.0000 mg | ORAL_TABLET | Freq: Every day | ORAL | 3 refills | Status: DC

## 2019-05-02 NOTE — Patient Instructions (Signed)
Rosacea  Rosacea is a long-term (chronic) condition that affects the skin of the face, including the cheeks, nose, forehead, and chin. This condition can also affect the eyes. Rosacea causes blood vessels near the surface of the skin to enlarge, which results in redness.  What are the causes?  The cause of this condition is not known. Certain triggers can make rosacea worse, including:  · Hot baths.  · Exercise.  · Sunlight.  · Very hot or cold temperatures.  · Hot or spicy foods and drinks.  · Drinking alcohol.  · Stress.  · Taking blood pressure medicine.  · Long-term use of topical steroids on the face.  What increases the risk?  You are more likely to develop this condition if you:  · Are older than 58 years of age.  · Are a woman.  · Have light-colored skin (light complexion).  · Have a family history of rosacea.  What are the signs or symptoms?  Symptoms of this condition include:  · Redness of the face.  · Red bumps or pimples on the face.  · A red, enlarged nose.  · Blushing easily.  · Red lines on the skin.  · Irritated, burning, or itchy feeling in the eyes.  · Swollen eyelids.  · Drainage from the eyes.  · Feeling like there is something in your eye.  How is this diagnosed?  This condition is diagnosed with a medical history and physical exam.  How is this treated?  There is no cure for this condition, but treatment can help to control your symptoms. Your health care provider may recommend that you see a skin specialist (dermatologist). Treatment may include:  · Medicines that are applied to the skin or taken by mouth (orally). This can include antibiotic medicines.  · Laser treatment to improve the appearance of the skin.  · Surgery. This is rare.  Your health care provider will also recommend the best way to take care of your skin. Even after your skin improves, you will likely need to continue treatment to prevent your rosacea from coming back.  Follow these instructions at home:  Skin care  Take care  of your skin as told by your health care provider. You may be told to do these things:  · Wash your skin gently two or more times each day.  · Use mild soap.  · Use a sunscreen or sunblock with SPF 30 or greater.  · Use gentle cosmetics that are meant for sensitive skin.  · Shave with an electric shaver instead of a blade.  Lifestyle  · Try to keep track of what foods trigger this condition. Avoid any triggers. These may include:  ? Spicy foods.  ? Seafood.  ? Cheese.  ? Hot liquids.  ? Nuts.  ? Chocolate.  ? Iodized salt.  · Do not drink alcohol.  · Avoid extremely cold or hot temperatures.  · Try to reduce your stress. If you need help, talk with your health care provider.  · When you exercise, do these things to stay cool:  ? Limit sun exposure to your face.  ? Use a fan.  ? Do shorter and more frequent intervals of exercise.  General instructions  · Take and apply over-the-counter and prescription medicines only as told by your health care provider.  · If you were prescribed an antibiotic medicine, apply it or take it as told by your health care provider. Do not stop using the antibiotic even if   your condition improves.  · If your eyelids are affected, apply warm compresses to them. Do this as told by your health care provider.  · Keep all follow-up visits as told by your health care provider. This is important.  Contact a health care provider if:  · Your symptoms get worse.  · Your symptoms do not improve after 2 months of treatment.  · You have new symptoms.  · You have any changes in vision or you have problems with your eyes, such as redness or itching.  · You feel depressed.  · You lose your appetite.  · You have trouble concentrating.  Summary  · Rosacea is a long-term (chronic) condition that affects the skin of the face, including the cheeks, nose, forehead, and chin.  · Take care of your skin as told by your health care provider.  · Take and apply over-the-counter and prescription medicines only as told  by your health care provider.  · Contact a health care provider if your symptoms get worse or if you have any changes in vision or other problems with your eyes, such as redness or itching.  · Keep all follow-up visits as told by your health care provider. This is important.  This information is not intended to replace advice given to you by your health care provider. Make sure you discuss any questions you have with your health care provider.  Document Released: 01/15/2005 Document Revised: 05/12/2018 Document Reviewed: 05/12/2018  Elsevier Interactive Patient Education © 2019 [employer] Inc.

## 2019-05-02 NOTE — Addendum Note (Signed)
Addended by: Almeta Monas on: 05/02/2019 10:39 AM   Modules accepted: Orders

## 2019-05-02 NOTE — Progress Notes (Signed)
Subjective:    Patient ID: Stacy Salazar, female    DOB: May 19, 1961, 58 y.o.   MRN: [id number]  Chief Complaint  Patient presents with  . Medical Management of Chronic Issues    six month recheck   PT presents to the office today for chronic follow up.  Thyroid Problem  Presents for follow-up visit. Symptoms include fatigue and hair loss. Patient reports no constipation, depressed mood, diarrhea or dry skin. The symptoms have been stable.  Anemia  Presents for follow-up visit. Symptoms include malaise/fatigue. There has been no anorexia or confusion.  Arthritis  Presents for follow-up visit. She complains of pain and stiffness. The symptoms have been stable. Affected locations include the left knee. Her pain is at a severity of 3/10. Associated symptoms include fatigue. Pertinent negatives include no diarrhea.  OSA Uses CPAP nightly. Doing well.  RLS Using Requip every night. Helps at time, but continues intermittently.  Rosacea She continues to use Metrogel BID. She completed her doxycycline 100 mg daily. States her face has improved with the "bumps", but still red.   Review of Systems  Constitutional: Positive for fatigue and malaise/fatigue.  Gastrointestinal: Negative for anorexia, constipation and diarrhea.  Musculoskeletal: Positive for arthritis and stiffness.  Psychiatric/Behavioral: Negative for confusion.  All other systems reviewed and are negative.      Objective:   Physical Exam Vitals signs reviewed.  Constitutional:      General: She is not in acute distress.    Appearance: She is well-developed. She is obese.  HENT:     Head: Normocephalic and atraumatic.     Right Ear: Tympanic membrane normal. There is impacted cerumen.     Left Ear: Tympanic membrane normal. There is impacted cerumen.  Eyes:     Pupils: Pupils are equal, round, and reactive to light.  Neck:     Musculoskeletal: Normal range of motion and neck supple.     Thyroid: No thyromegaly.   Cardiovascular:     Rate and Rhythm: Normal rate and regular rhythm.     Heart sounds: Normal heart sounds. No murmur.  Pulmonary:     Effort: Pulmonary effort is normal. No respiratory distress.     Breath sounds: Normal breath sounds. No wheezing.  Abdominal:     General: Bowel sounds are normal. There is no distension.     Palpations: Abdomen is soft.     Tenderness: There is no abdominal tenderness.  Musculoskeletal: Normal range of motion.        General: No tenderness.     Right lower leg: Edema present.     Left lower leg: Edema present.  Skin:    General: Skin is warm and dry.  Neurological:     Mental Status: She is alert and oriented to person, place, and time.     Cranial Nerves: No cranial nerve deficit.     Deep Tendon Reflexes: Reflexes are normal and symmetric.  Psychiatric:        Behavior: Behavior normal.        Thought Content: Thought content normal.        Judgment: Judgment normal.     Bilateral ears washed with warm water and peroxide mix by nurse. TM WNL  BP 136/81   Pulse (!) 58   Temp 97.9 F (36.6 C) (Oral)   Ht '5\' 7"'  (1.702 m)   Wt 273 lb 6.4 oz (124 kg)   BMI 42.82 kg/m      Assessment & Plan:  Jasmeen Fritsch comes in today with chief complaint of Medical Management of Chronic Issues (six month recheck)   Diagnosis and orders addressed:  1. Hypothyroidism, unspecified type - CMP14+EGFR - CBC with Differential/Platelet - TSH  2. Iron deficiency anemia, unspecified iron deficiency anemia type - CMP14+EGFR - CBC with Differential/Platelet  3. Morbid obesity ([place]) - CMP14+EGFR - CBC with Differential/Platelet  4. OSA (obstructive sleep apnea) - CMP14+EGFR - CBC with Differential/Platelet  5. Restless leg syndrome - CMP14+EGFR - CBC with Differential/Platelet  6. Rosacea - Discussed lifestyle changes - CMP14+EGFR - CBC with Differential/Platelet - doxycycline (VIBRA-TABS) 100 MG tablet; Take 1 tablet (100 mg total) by mouth  daily.  Dispense: 30 tablet; Refill: 3  7. Excessive cerumen in ear canal, bilateral   Labs pending Health Maintenance reviewed- TDAP given and she will scheduled pap Diet and exercise encouraged  Follow up plan: 6 months   [name], FNP

## 2019-05-03 LAB — CBC WITH DIFFERENTIAL/PLATELET
Basophils Absolute: 0.1 10*3/uL (ref 0.0–0.2)
Basos: 1 %
EOS (ABSOLUTE): 0.2 10*3/uL (ref 0.0–0.4)
Eos: 3 %
Hematocrit: 34 % (ref 34.0–46.6)
Hemoglobin: 12 g/dL (ref 11.1–15.9)
Immature Grans (Abs): 0 10*3/uL (ref 0.0–0.1)
Immature Granulocytes: 0 %
Lymphocytes Absolute: 2.6 10*3/uL (ref 0.7–3.1)
Lymphs: 42 %
MCH: 30.3 pg (ref 26.6–33.0)
MCHC: 35.3 g/dL (ref 31.5–35.7)
MCV: 86 fL (ref 79–97)
Monocytes Absolute: 0.4 10*3/uL (ref 0.1–0.9)
Monocytes: 7 %
Neutrophils Absolute: 2.9 10*3/uL (ref 1.4–7.0)
Neutrophils: 47 %
Platelets: 278 10*3/uL (ref 150–450)
RBC: 3.96 x10E6/uL (ref 3.77–5.28)
RDW: 12 % (ref 11.7–15.4)
WBC: 6.1 10*3/uL (ref 3.4–10.8)

## 2019-05-03 LAB — CMP14+EGFR
ALT: 18 IU/L (ref 0–32)
AST: 17 IU/L (ref 0–40)
Albumin/Globulin Ratio: 1.9 (ref 1.2–2.2)
Albumin: 4.3 g/dL (ref 3.8–4.9)
Alkaline Phosphatase: 67 IU/L (ref 39–117)
BUN/Creatinine Ratio: 23 (ref 9–23)
BUN: 15 mg/dL (ref 6–24)
Bilirubin Total: 0.3 mg/dL (ref 0.0–1.2)
CO2: 23 mmol/L (ref 20–29)
Calcium: 9.8 mg/dL (ref 8.7–10.2)
Chloride: 105 mmol/L (ref 96–106)
Creatinine, Ser: 0.64 mg/dL (ref 0.57–1.00)
GFR calc Af Amer: 114 mL/min/{1.73_m2} (ref >59)
GFR calc non Af Amer: 99 mL/min/{1.73_m2} (ref >59)
Globulin, Total: 2.3 g/dL (ref 1.5–4.5)
Glucose: 85 mg/dL (ref 65–99)
Potassium: 4.8 mmol/L (ref 3.5–5.2)
Sodium: 144 mmol/L (ref 134–144)
Total Protein: 6.6 g/dL (ref 6.0–8.5)

## 2019-05-03 LAB — TSH: TSH: 0.685 u[IU]/mL (ref 0.450–4.500)

## 2019-05-17 ENCOUNTER — Ambulatory Visit: Payer: Federal, State, Local not specified - PPO | Admitting: Skilled Nursing Facility1

## 2019-06-07 DIAGNOSIS — Z0289 Encounter for other administrative examinations: Secondary | ICD-10-CM

## 2019-06-13 ENCOUNTER — Other Ambulatory Visit: Payer: Self-pay | Admitting: Family

## 2019-06-15 ENCOUNTER — Telehealth: Payer: Self-pay | Admitting: *Deleted

## 2019-06-15 NOTE — Telephone Encounter (Signed)
Pt is aware Stacy Salazar is out but will send her a message to check on status of paperwork.

## 2019-06-16 ENCOUNTER — Telehealth: Payer: Self-pay | Admitting: Family

## 2019-06-16 NOTE — Telephone Encounter (Signed)
Form - re- completed and re-faxed

## 2019-06-28 NOTE — Telephone Encounter (Signed)
Closing encounter, under media papers were faxed back on 06/15/19

## 2019-07-01 ENCOUNTER — Other Ambulatory Visit: Payer: Self-pay

## 2019-07-01 DIAGNOSIS — I83813 Varicose veins of bilateral lower extremities with pain: Secondary | ICD-10-CM

## 2019-07-04 ENCOUNTER — Ambulatory Visit (HOSPITAL_COMMUNITY)
Admission: RE | Admit: 2019-07-04 | Discharge: 2019-07-04 | Disposition: A | Discharge status: Home / Self Care | Payer: Federal, State, Local not specified - PPO | Source: Ambulatory Visit | Attending: Family | Admitting: Family

## 2019-07-04 ENCOUNTER — Encounter: Payer: Self-pay | Admitting: Surgery

## 2019-07-04 ENCOUNTER — Other Ambulatory Visit: Payer: Self-pay

## 2019-07-04 ENCOUNTER — Ambulatory Visit: Payer: Federal, State, Local not specified - PPO | Admitting: Surgery

## 2019-07-04 VITALS — BP 134/83 | HR 68 | Temp 97.6°F | Resp 20 | Ht 67.0 in | Wt 278.0 lb

## 2019-07-04 DIAGNOSIS — I83813 Varicose veins of bilateral lower extremities with pain: Secondary | ICD-10-CM

## 2019-07-04 NOTE — Progress Notes (Signed)
[facility] [place]  Patient name: Stacy Salazar MRN: [id number] DOB: [birth year] Sex: female   REQUESTING PROVIDER:    Jannifer Rodneyhristy Hawks   REASON FOR CONSULT:    Varicose vein with pain  HISTORY OF PRESENT ILLNESS:   Stacy ChaletDonna Heslop is a 58 y.o. female, who is referred for evaluation of varicose veins.  The patient states that she has been having pain for many years.  It is gotten worse over the course of the past year.  Her left leg bothers her more than the right.  She reports having some form of vein stripping and ligation in [place] in the [place].  Nothing was done above the knee.  She states that her legs bother her in the morning and when she is driving in a car.  She tries Tylenol to help with the pain as well as elevation without significant relief.  She was fitted for 20-30 thigh-high compression stockings 6 months ago and has worn these without significant relief.  She denies any history of DVT.  PAST MEDICAL HISTORY    History reviewed. No pertinent past medical history.   FAMILY HISTORY   Family History  Problem Relation Age of Onset  . Alcohol abuse Mother   . Cancer Mother   . Cirrhosis Mother   . Heart disease Father     SOCIAL HISTORY:   Social History   Socioeconomic History  . Marital status: Divorced    Spouse name: Not on file  . Number of children: Not on file  . Years of education: Not on file  . Highest education level: Not on file  Occupational History  . Not on file  Social Needs  . Financial resource strain: Not on file  . Food insecurity    Worry: Not on file    Inability: Not on file  . Transportation needs    Medical: Not on file    Non-medical: Not on file  Tobacco Use  . Smoking status: Never Smoker  . Smokeless tobacco: Never Used  Substance and Sexual Activity  . Alcohol use: Not Currently    Comment: Glass of wine monthly  . Drug use: Never  . Sexual  activity: Not Currently  Lifestyle  . Physical activity    Days per week: Not on file    Minutes per session: Not on file  . Stress: Not on file  Relationships  . Social Musicianconnections    Talks on phone: Not on file    Gets together: Not on file    Attends religious service: Not on file    Active member of club or organization: Not on file    Attends meetings of clubs or organizations: Not on file    Relationship status: Not on file  . Intimate partner violence    Fear of current or ex partner: Not on file    Emotionally abused: Not on file    Physically abused: Not on file    Forced sexual activity: Not on file  Other Topics Concern  . Not on file  Social History Narrative  . Not on file    ALLERGIES:    Allergies  Allergen Reactions  . Nsaids     Gastric bypass .    CURRENT MEDICATIONS:    Current Outpatient Medications  Medication Sig Dispense Refill  . CALCIUM CITRATE PO Take 500 mg by mouth 2 (two) times daily.    Marland Kitchen. doxycycline (VIBRA-TABS) 100 MG tablet Take  1 tablet (100 mg total) by mouth daily. 30 tablet 3  . levothyroxine (SYNTHROID, LEVOTHROID) 200 MCG tablet TAKE 1 TABLET BY MOUTH ONCE DAILY IN THE MORNING ON AN EMPTY STOMACH 90 tablet 1  . levothyroxine (SYNTHROID, LEVOTHROID) 25 MCG tablet TAKE 1 TABLET BY MOUTH ONCE DAILY BEFORE BREAKFAST 90 tablet 2  . metroNIDAZOLE (METROGEL) 0.75 % gel Apply 1 application topically 2 (two) times daily. 45 g 4  . Multiple Vitamins-Minerals (MULTIVITAMIN ADULTS PO) Take by mouth.    Marland Kitchen. rOPINIRole (REQUIP) 1 MG tablet Take 3 tablets (3 mg total) by mouth at bedtime. 90 tablet 2   No current facility-administered medications for this visit.     REVIEW OF SYSTEMS:   [X]  denotes positive finding, [ ]  denotes negative finding Cardiac  Comments:  Chest pain or chest pressure:    Shortness of breath upon exertion:    Short of breath when lying flat:    Irregular heart rhythm:        Vascular    Pain in calf, thigh, or  hip brought on by ambulation:    Pain in feet at night that wakes you up from your sleep:     Blood clot in your veins:    Leg swelling:  x       Pulmonary    Oxygen at home:    Productive cough:     Wheezing:         Neurologic    Sudden weakness in arms or legs:     Sudden numbness in arms or legs:     Sudden onset of difficulty speaking or slurred speech:    Temporary loss of vision in one eye:     Problems with dizziness:         Gastrointestinal    Blood in stool:      Vomited blood:         Genitourinary    Burning when urinating:     Blood in urine:        Psychiatric    Major depression:         Hematologic    Bleeding problems:    Problems with blood clotting too easily:        Skin    Rashes or ulcers:        Constitutional    Fever or chills:     PHYSICAL EXAM:   Vitals:   07/04/19 1343  BP: 134/83  Pulse: 68  Resp: 20  Temp: 97.6 F (36.4 C)  SpO2: 96%  Weight: 278 lb (126.1 kg)  Height: 5\' 7"  (1.702 m)    GENERAL: The patient is a well-nourished female, in no acute distress. The vital signs are documented above. CARDIAC: There is a regular rate and rhythm.  VASCULAR: Palpable pedal pulses.  Ropelike varicosities behind the left knee.  1+ pitting edema bilaterally PULMONARY: Nonlabored respirations ABDOMEN: Soft and non-tender with normal pitched bowel sounds.  MUSCULOSKELETAL: There are no major deformities or cyanosis. NEUROLOGIC: No focal weakness or paresthesias are detected. SKIN: There are no ulcers or rashes noted. PSYCHIATRIC: The patient has a normal affect.  STUDIES:   I have reviewed the following: Venous Reflux Times Normal value < 0.5 sec +------------------------------+----------+---------+                               Right (ms)Left (ms) +------------------------------+----------+---------+ CFV  1034.00   2068.00   +------------------------------+----------+---------+ FV                                       1980.00   +------------------------------+----------+---------+ Popliteal                               1335.00   +------------------------------+----------+---------+ GSV at Saphenofemoral junction2050.00   2677.00   +------------------------------+----------+---------+ GSV prox thigh                3191.00   1890.00   +------------------------------+----------+---------+ GSV mid thigh                 2050.00   2000.00   +------------------------------+----------+---------+ GSV dist thigh                2861.00   2861.00   +------------------------------+----------+---------+ GSV at knee                   2868.00             +------------------------------+----------+---------+  +------------------------------+----------+---------+ VEIN DIAMETERS:               Right (cm)Left (cm) +------------------------------+----------+---------+ GSV at Saphenofemoral junction1.28      1.15      +------------------------------+----------+---------+ GSV at prox thigh             0.69      0.93      +------------------------------+----------+---------+ GSV at mid thigh              0.63      0.79      +------------------------------+----------+---------+ GSV at distal thigh           0.75      0.77      +------------------------------+----------+---------+ GSV at knee                   0.66      0.38      +------------------------------+----------+---------+ GSV prox calf                 0.54                +------------------------------+----------+---------+ SSV prox                      0.34                +------------------------------+----------+---------+ SSV mid                       0.27                +------------------------------+----------+---------+  Right: No reflux was noted in the femoral vein in the thigh, and popliteal vein. Abnormal reflux times were noted in the common femoral vein, great  saphenous vein at the saphenofemoral junction, great saphenous vein at the proximal thigh, great saphenous  vein at the mid thigh, great saphenous vein at the distal thigh, and great saphenous vein at the knee. There is no evidence of deep vein thrombosis in the lower extremity.There is no evidence of superficial venous thrombosis. Left: No reflux was noted in the femoral vein in the thigh. Abnormal reflux times were noted in the common femoral vein, femoral vein in the thigh, popliteal vein, great saphenous vein at the saphenofemoral junction, great  saphenous vein at the proximal  thigh, great saphenous vein at the mid thigh, and great saphenous vein at the distal thigh. There is no evidence of deep vein thrombosis in the lower extremity.There is no evidence of superficial venous thrombosis. ASSESSMENT and PLAN   Varicose veins with complications: The patient has CEAP class III/IV disease.  She is having frequent pain which is become very bothersome to her particularly behind the left knee where she has large varicosities.  She has not gotten any significant relief from compression stockings or elevation.  Ultrasound today identifies incompetent bilateral saphenous veins.  We discussed laser ablation with stab phlebectomy beginning in the left leg.  Because she has been wearing compression stockings of the appropriate length and grade 4/6 months, I will send her to our vein clinic where hopefully they will consider intervention and proceed with insurance approval.  All the patient's questions were answered today she understands and is in agreement with this plan.   Charlena CrossWells Hamna Asa, IV, MD, FACS [facility] Tel [phone] Pager [phone]

## 2019-07-11 ENCOUNTER — Other Ambulatory Visit: Payer: Self-pay | Admitting: Family

## 2019-07-11 DIAGNOSIS — G2581 Restless legs syndrome: Secondary | ICD-10-CM

## 2019-07-11 MED ORDER — ROPINIROLE HCL 1 MG PO TABS: 3.0000 mg | ORAL_TABLET | Freq: Every day | ORAL | 2 refills | Status: DC

## 2019-07-11 NOTE — Telephone Encounter (Signed)
What is the name of the medication? Requip  Have you contacted your pharmacy to request a refill? yes  Which pharmacy would you like this sent to? [facility] in [place]   Patient notified that their request is being sent to the clinical staff for review and that they should receive a call once it is complete. If they do not receive a call within 24 hours they can check with their pharmacy or our office.

## 2019-07-21 ENCOUNTER — Other Ambulatory Visit: Payer: Self-pay

## 2019-07-21 ENCOUNTER — Ambulatory Visit (INDEPENDENT_AMBULATORY_CARE_PROVIDER_SITE_OTHER): Payer: Federal, State, Local not specified - PPO | Admitting: Vascular Surgery

## 2019-07-21 ENCOUNTER — Encounter: Payer: Self-pay | Admitting: Vascular Surgery

## 2019-07-21 VITALS — BP 127/82 | HR 67 | Temp 98.0°F | Resp 16 | Ht 67.0 in | Wt 281.7 lb

## 2019-07-21 DIAGNOSIS — I83813 Varicose veins of bilateral lower extremities with pain: Secondary | ICD-10-CM | POA: Diagnosis not present

## 2019-07-21 NOTE — Progress Notes (Signed)
Patient name: Stacy Salazar MRN: [id number] DOB: [birth year] Sex: female  REASON FOR VISIT:   Follow-up of venous disease.  HPI:   Stacy Salazar is a pleasant 58 y.o. female who was seen 2 weeks ago by Dr. Durene CalWells Brabham.  The patient was referred with varicose veins.  Patient had been having problems for many years.  She had been fitted for thigh-high compression stockings with a gradient of 20 to 30 mmHg 6 months ago but these did not provide significant relief.  She is also been doing leg elevation and Tylenol as needed for pain.  Her venous duplex scan showed reflux in both great saphenous veins and she was felt to be a good candidate for laser ablation of the saphenous veins in a staged fashion with stab phlebectomies.  Symptoms were more significant on the left side.  The patient continues to have significant pain and swelling and aching in both lower extremities.  Her symptoms are aggravated by sitting and standing and relieved somewhat with elevation.  She is had no previous history of DVT or phlebitis.  Past Medical History:  Diagnosis Date  . Varicose veins of bilateral lower extremities with other complications     Family History  Problem Relation Age of Onset  . Alcohol abuse Mother   . Cancer Mother   . Cirrhosis Mother   . Heart disease Father     SOCIAL HISTORY: Social History   Tobacco Use  . Smoking status: Never Smoker  . Smokeless tobacco: Never Used  Substance Use Topics  . Alcohol use: Not Currently    Comment: Glass of wine monthly    Allergies  Allergen Reactions  . Nsaids     Gastric bypass .    Current Outpatient Medications  Medication Sig Dispense Refill  . CALCIUM CITRATE PO Take 500 mg by mouth 2 (two) times daily.    Marland Kitchen. doxycycline (VIBRA-TABS) 100 MG tablet Take 1 tablet (100 mg total) by mouth daily. 30 tablet 3  . levothyroxine (SYNTHROID, LEVOTHROID) 200 MCG tablet TAKE 1 TABLET BY MOUTH ONCE DAILY IN THE MORNING ON AN EMPTY STOMACH 90  tablet 1  . levothyroxine (SYNTHROID, LEVOTHROID) 25 MCG tablet TAKE 1 TABLET BY MOUTH ONCE DAILY BEFORE BREAKFAST 90 tablet 2  . metroNIDAZOLE (METROGEL) 0.75 % gel Apply 1 application topically 2 (two) times daily. 45 g 4  . Multiple Vitamins-Minerals (MULTIVITAMIN ADULTS PO) Take by mouth.    Marland Kitchen. rOPINIRole (REQUIP) 1 MG tablet Take 3 tablets (3 mg total) by mouth at bedtime. 90 tablet 2   No current facility-administered medications for this visit.     REVIEW OF SYSTEMS:  [X]  denotes positive finding, [ ]  denotes negative finding Cardiac  Comments:  Chest pain or chest pressure:    Shortness of breath upon exertion:    Short of breath when lying flat:    Irregular heart rhythm:        Vascular    Pain in calf, thigh, or hip brought on by ambulation:    Pain in feet at night that wakes you up from your sleep:     Blood clot in your veins:    Leg swelling:         Pulmonary    Oxygen at home:    Productive cough:     Wheezing:         Neurologic    Sudden weakness in arms or legs:     Sudden numbness in arms or legs:  Sudden onset of difficulty speaking or slurred speech:    Temporary loss of vision in one eye:     Problems with dizziness:         Gastrointestinal    Blood in stool:     Vomited blood:         Genitourinary    Burning when urinating:     Blood in urine:        Psychiatric    Major depression:         Hematologic    Bleeding problems:    Problems with blood clotting too easily:        Skin    Rashes or ulcers:        Constitutional    Fever or chills:     PHYSICAL EXAM:   Vitals:   07/21/19 1447  BP: 127/82  Pulse: 67  Resp: 16  Temp: 98 F (36.7 C)  TempSrc: Temporal  SpO2: 96%  Weight: 281 lb 11.2 oz (127.8 kg)  Height: 5\' 7"  (1.702 m)   Body mass index is 44.12 kg/m.   GENERAL: The patient is a well-nourished female, in no acute distress. The vital signs are documented above. CARDIAC: There is a regular rate and rhythm.   VASCULAR: I do not detect carotid bruits. Both feet are warm and well-perfused. VENOUS EXAM: She does have some dilated varicose veins in her posterior left distal thigh and proximal calf.   She does have some hyperpigmentation bilaterally. I did look at both saphenous veins myself with the SonoSite.  On the left side, it looks like we could potentially access the vein in the mid thigh.  The vein is quite deep there if we were not able to access it there we could move to the proximal third of the vein where it is slightly more superficial.  On the right side she has significant reflux in the great saphenous vein and the vein is dilated.  Again we could likely access this in the mid thigh.  PULMONARY: There is good air exchange bilaterally without wheezing or rales. ABDOMEN: Soft and non-tender with normal pitched bowel sounds.  MUSCULOSKELETAL: There are no major deformities or cyanosis. NEUROLOGIC: No focal weakness or paresthesias are detected. SKIN: There are no ulcers or rashes noted. PSYCHIATRIC: The patient has a normal affect.  DATA:    VENOUS DUPLEX: I reviewed the venous duplex scan that was done at the last visit.  On the right side there was no evidence of DVT.  There is deep venous reflux involving the common femoral vein.  There was superficial venous reflux involving the right great saphenous vein from the knee to the saphenofemoral junction.  The vein on the right was significantly dilated with diameters ranging from 0.63-0.75 cm.    MEDICAL ISSUES:   CHRONIC VENOUS INSUFFICIENCY: This patient has significant deep venous reflux on the left and superficial venous reflux involving the great saphenous vein.  She is failed conservative treatment including leg elevation, compression stockings, and ibuprofen as needed for pain.  I think she is a reasonable candidate for laser ablation of the left great saphenous vein and potentially stab phlebectomies of the varicose veins in  her posterior leg.  I have discussed the indications for endovenous laser ablation of the left GSV, that is to lower the pressure in the veins and potentially help relieve the symptoms from venous hypertension. I have also discussed alternative options including conservative treatment with leg elevation, compression therapy, exercise, avoiding  prolonged sitting and standing, and weight management. I have discussed the potential complications of the procedure, including, but not limited to: bleeding, bruising, leg swelling, nerve injury, skin burns, significant pain from phlebitis, deep venous thrombosis, or failure of the vein to close.  I have also explained that venous insufficiency is a chronic disease, and that the patient is at risk for recurrent varicose veins in the future.  All of the patient's questions were encouraged and answered. They are agreeable to proceed.   I have discussed with the patient the indications for stab phlebectomy.  I have explained to the patient that that will have small scars from the stab incisions.  I explained that the other risks include leg swelling, bruising, bleeding, and phlebitis.  All the patient's questions were encouraged and answered and they are agreeable to proceed.  We could potentially do staged ablation of the right great saphenous vein.  Certainly the procedure will be more technically challenging given her obesity.  Her BMI is 44.  However she is having disabling symptoms and I think that we need to attempt to address her superficial venous reflux and help lower her venous pressure.  We have also discussed conservative treatment for venous disease which I think will be helpful after the procedure also.  We discussed the importance of intermittent leg elevation the proper positioning for this.  She will continue to wear her compression stockings.  I have encouraged her to avoid prolonged sitting and standing.  She is exercising and it belongs to a hiking  club.  I also explained that I think water aerobics is very helpful for patients with venous disease.  We also discussed the importance of weight management.  We will schedule her in the near future for staged laser ablation of the great saphenous veins.  Deitra Mayo [facility] [phone]

## 2019-07-26 ENCOUNTER — Other Ambulatory Visit: Payer: Self-pay | Admitting: *Deleted

## 2019-07-26 DIAGNOSIS — I83813 Varicose veins of bilateral lower extremities with pain: Secondary | ICD-10-CM

## 2019-08-10 ENCOUNTER — Telehealth (HOSPITAL_COMMUNITY): Payer: Self-pay | Admitting: Rehabilitation

## 2019-08-10 NOTE — Telephone Encounter (Signed)

## 2019-08-11 ENCOUNTER — Encounter: Payer: Self-pay | Admitting: Vascular Surgery

## 2019-08-11 ENCOUNTER — Ambulatory Visit (INDEPENDENT_AMBULATORY_CARE_PROVIDER_SITE_OTHER): Payer: Federal, State, Local not specified - PPO | Admitting: Vascular Surgery

## 2019-08-11 ENCOUNTER — Other Ambulatory Visit: Payer: Self-pay

## 2019-08-11 VITALS — BP 129/71 | HR 68 | Temp 99.0°F | Resp 16 | Ht 67.0 in | Wt 282.0 lb

## 2019-08-11 DIAGNOSIS — I83813 Varicose veins of bilateral lower extremities with pain: Secondary | ICD-10-CM

## 2019-08-11 HISTORY — PX: ENDOVENOUS ABLATION SAPHENOUS VEIN W/ LASER: SUR449

## 2019-08-11 NOTE — Progress Notes (Signed)
Patient name: Stacy Salazar MRN: [id number] DOB: 11/02/1961 Sex: female  REASON FOR VISIT:   For laser ablation and stab phlebectomies left lower extremity  HPI:   Stacy Salazar is a pleasant 58 y.o. female who failed conservative treatment of her varicose veins and chronic venous insufficiency.  She presents for laser ablation of the left great saphenous vein and stab phlebectomies.  Current Outpatient Medications  Medication Sig Dispense Refill  . CALCIUM CITRATE PO Take 500 mg by mouth 2 (two) times daily.    Marland Kitchen doxycycline (VIBRA-TABS) 100 MG tablet Take 1 tablet (100 mg total) by mouth daily. 30 tablet 3  . levothyroxine (SYNTHROID, LEVOTHROID) 200 MCG tablet TAKE 1 TABLET BY MOUTH ONCE DAILY IN THE MORNING ON AN EMPTY STOMACH 90 tablet 1  . levothyroxine (SYNTHROID, LEVOTHROID) 25 MCG tablet TAKE 1 TABLET BY MOUTH ONCE DAILY BEFORE BREAKFAST 90 tablet 2  . metroNIDAZOLE (METROGEL) 0.75 % gel Apply 1 application topically 2 (two) times daily. 45 g 4  . Multiple Vitamins-Minerals (MULTIVITAMIN ADULTS PO) Take by mouth.    Marland Kitchen rOPINIRole (REQUIP) 1 MG tablet Take 3 tablets (3 mg total) by mouth at bedtime. 90 tablet 2   No current facility-administered medications for this visit.     REVIEW OF SYSTEMS:  [X]  denotes positive finding, [ ]  denotes negative finding Vascular    Leg swelling    Cardiac    Chest pain or chest pressure:    Shortness of breath upon exertion:    Short of breath when lying flat:    Irregular heart rhythm:    Constitutional    Fever or chills:     PHYSICAL EXAM:   Vitals:   08/11/19 1106  BP: 129/71  Pulse: 68  Resp: 16  Temp: 99 F (37.2 C)  TempSrc: Temporal  SpO2: 99%  Weight: 282 lb (127.9 kg)  Height: 5\' 7"  (1.702 m)    GENERAL: The patient is a well-nourished female, in no acute distress. The vital signs are documented above.   DATA:   No new data  MEDICAL ISSUES:   LASER ABLATION LEFT GREAT SAPHENOUS VEIN/STAB PHLEBECTOMIES:  The patient was brought to the exam room and with the patient standing the dilated varicose veins were marked with the patient standing.  Patient was then placed supine.  I interrogated the great saphenous vein with the SonoSite in the left leg and it appeared that the best place to cannulate this was the mid thigh.  The vein was quite deep.  The left leg was prepped and draped in usual sterile fashion.  Under ultrasound guidance, after the skin was anesthetized, the left great saphenous vein was cannulated in the mid thigh with a micropuncture needle and a micropuncture sheath was introduced over a wire.  I then advanced the J-wire through the micropuncture sheath up to the saphenofemoral junction.  A 35 cm sheath was advanced over the wire and the dilator removed.  The sheath was positioned just below the saphenofemoral junction about 2-1/2 cm.  The laser fiber was then positioned at the end of the sheath and then the sheath slightly retracted.  Tumescent anesthesia was administered circumferentially around the vein.  The patient was then placed in Trendelenburg.  Laser ablation was then performed from 2-1/2 cm distal to the saphenofemoral junction to the mid thigh.  A total of 1233 J of energy were used.  Attention was then turned to the stab phlebectomies.  All the areas that were marked were anesthetized  with tumescent anesthesia.  Using small stab incisions with an 11 blade the veins were clipped and brought into the skin and then bluntly removed with blunt dissection.  Pressure was held for hemostasis.  Dressing was applied.  Patient tolerated the procedure well.  She will will return in 1 week for follow-up duplex.  Waverly Ferrarihristopher Wadie Mattie [facility] [phone]

## 2019-08-11 NOTE — Progress Notes (Signed)
     Laser Ablation Procedure    Date: 08/11/2019   [name] DOB:1961/06/23  Consent signed: Yes    Surgeon:  Dr. Gae Gallop  Procedure: Laser Ablation: left Greater Saphenous Vein  BP 129/71 (BP Location: Left Arm, Patient Position: Sitting, Cuff Size: Large)   Pulse 68   Temp 99 F (37.2 C) (Temporal)   Resp 16   Ht 5\' 7"  (1.702 m)   Wt 282 lb (127.9 kg)   SpO2 99%   BMI 44.17 kg/m   Tumescent Anesthesia: 500 cc 0.9% NaCl with 50 cc Lidocaine HCL 1 % and 15 cc 8.4% NaHCO3  Local Anesthesia: 10 cc Lidocaine HCL and NaHCO3 (ratio 2:1)  15 watts continuous mode        Total energy: 1233 Joules   Total time: 1:21  Laser fiber ref. [id number]  Lot# [id number]  Stab Phlebectomy: 10-20 Sites: Thigh and Calf  Patient tolerated procedure well  Notes: Mrs. Downs wore facial mask.  All staff members wore facial masks and facial shields/goggles. Discharge instructions reviewed with patient and hardcopy given to patient to take home.    Description of Procedure:  After marking the course of the secondary varicosities, the patient was placed on the operating table in the supine position, and the left leg was prepped and draped in sterile fashion.   Local anesthetic was administered and under ultrasound guidance the saphenous vein was accessed with a micro needle and guide wire; then the mirco puncture sheath was placed.  A guide wire was inserted saphenofemoral junction , followed by a 5 french sheath.  The position of the sheath and then the laser fiber below the junction was confirmed using the ultrasound.  Tumescent anesthesia was administered along the course of the saphenous vein using ultrasound guidance. The patient was placed in Trendelenburg position and protective laser glasses were placed on patient and staff, and the laser was fired at 15 watts continuous mode advancing 1-46mm/second for a total of 1233 joules.   For stab phlebectomies, local anesthetic was administered at  the previously marked varicosities, and tumescent anesthesia was administered around the vessels.  Ten to 20 stab wounds were made using the tip of an 11 blade. And using the vein hook, the phlebectomies were performed using a hemostat to avulse the varicosities.  Adequate hemostasis was achieved.     Steri strips were applied to the stab wounds and ABD pads and thigh high compression stockings were applied.  Ace wrap bandages were applied over the phlebectomy sites and at the top of the saphenofemoral junction. Blood loss was less than 15 cc.  The patient ambulated out of the operating room having tolerated the procedure well.

## 2019-08-17 ENCOUNTER — Telehealth (HOSPITAL_COMMUNITY): Payer: Self-pay | Admitting: Rehabilitation

## 2019-08-17 NOTE — Telephone Encounter (Signed)

## 2019-08-18 ENCOUNTER — Encounter: Payer: Self-pay | Admitting: Vascular Surgery

## 2019-08-18 ENCOUNTER — Ambulatory Visit (INDEPENDENT_AMBULATORY_CARE_PROVIDER_SITE_OTHER): Payer: Self-pay | Admitting: Vascular Surgery

## 2019-08-18 ENCOUNTER — Other Ambulatory Visit: Payer: Self-pay

## 2019-08-18 ENCOUNTER — Ambulatory Visit (HOSPITAL_COMMUNITY)
Admission: RE | Admit: 2019-08-18 | Discharge: 2019-08-18 | Disposition: A | Discharge status: Home / Self Care | Payer: Federal, State, Local not specified - PPO | Source: Ambulatory Visit | Attending: Vascular Surgery | Admitting: Vascular Surgery

## 2019-08-18 VITALS — BP 130/82 | HR 69 | Temp 97.3°F | Resp 14

## 2019-08-18 DIAGNOSIS — Z48812 Encounter for surgical aftercare following surgery on the circulatory system: Secondary | ICD-10-CM

## 2019-08-18 DIAGNOSIS — I83813 Varicose veins of bilateral lower extremities with pain: Secondary | ICD-10-CM | POA: Diagnosis not present

## 2019-08-18 NOTE — Progress Notes (Signed)
Patient name: Stacy Salazar MRN: [id number] DOB: December 24, 1960 Sex: female  REASON FOR VISIT:   Follow-up after laser ablation left great saphenous vein and stab phlebectomies  HPI:   Stacy Salazar is a pleasant 58 y.o. female who underwent laser ablation of the left great saphenous vein from the saphenofemoral junction to the mid thigh on 08/11/2019.  She also had 10-20 stab phlebectomies.  She returns for follow-up visit.  Since I saw her last week she is doing well.  She states that her left leg feels much better.  She is no longer having the aching pain she was having before.  She has no specific complaints.  She does continue to have aching pain and heaviness in the right leg associated with her chronic venous insufficiency on the right.  She has been wearing her compression stockings.  She elevates her legs daily.  She takes ibuprofen as needed.  Current Outpatient Medications  Medication Sig Dispense Refill  . CALCIUM CITRATE PO Take 500 mg by mouth 2 (two) times daily.    Marland Kitchen doxycycline (VIBRA-TABS) 100 MG tablet Take 1 tablet (100 mg total) by mouth daily. 30 tablet 3  . levothyroxine (SYNTHROID, LEVOTHROID) 200 MCG tablet TAKE 1 TABLET BY MOUTH ONCE DAILY IN THE MORNING ON AN EMPTY STOMACH 90 tablet 1  . levothyroxine (SYNTHROID, LEVOTHROID) 25 MCG tablet TAKE 1 TABLET BY MOUTH ONCE DAILY BEFORE BREAKFAST 90 tablet 2  . metroNIDAZOLE (METROGEL) 0.75 % gel Apply 1 application topically 2 (two) times daily. 45 g 4  . Multiple Vitamins-Minerals (MULTIVITAMIN ADULTS PO) Take by mouth.    Marland Kitchen rOPINIRole (REQUIP) 1 MG tablet Take 3 tablets (3 mg total) by mouth at bedtime. 90 tablet 2   No current facility-administered medications for this visit.     REVIEW OF SYSTEMS:  [X]  denotes positive finding, [ ]  denotes negative finding Vascular    Leg swelling    Cardiac    Chest pain or chest pressure:    Shortness of breath upon exertion:    Short of breath when lying flat:    Irregular  heart rhythm:    Constitutional    Fever or chills:     PHYSICAL EXAM:   Vitals:   08/18/19 1050  BP: 130/82  Pulse: 69  Resp: 14  Temp: (!) 97.3 F (36.3 C)  TempSrc: Temporal  SpO2: 98%    GENERAL: The patient is a well-nourished female, in no acute distress. The vital signs are documented above. CARDIOVASCULAR: There is a regular rate and rhythm. PULMONARY: There is good air exchange bilaterally without wheezing or rales. VASCULAR: She has some mild bruising in the medial left thigh.  Her incisions are covered with Steri-Strips and are healing nicely without any significant drainage. I did look at the right great saphenous vein myself with the SonoSite and this is dilated with reflux from the saphenofemoral junction to the knee.  DATA:   VENOUS DUPLEX: I have independently interpreted her venous duplex scan.  On the left side there is no evidence of DVT.  The vein and has been successfully closed from 0.98 cm distal to the saphenofemoral junction to the mid thigh.  MEDICAL ISSUES:   STATUS POST LASER ABLATION LEFT GREAT SAPHENOUS VEIN AND STAB PHLEBECTOMIES: The patient is doing well status post laser ablation of left great saphenous vein with stab phlebectomies.  She will continue to wear her thigh-high stockings for at least another week.  She will elevate her legs.  She is  having problems with her right leg and would like to proceed with laser ablation of the right great saphenous vein.  I think she is a good candidate for this based on my evaluation of the vein with the SonoSite.  This will be scheduled in the near future.  Waverly Ferrarihristopher Jaylenn Altier [facility] [phone]

## 2019-08-19 ENCOUNTER — Other Ambulatory Visit: Payer: Self-pay | Admitting: *Deleted

## 2019-08-19 DIAGNOSIS — I83813 Varicose veins of bilateral lower extremities with pain: Secondary | ICD-10-CM

## 2019-08-22 ENCOUNTER — Ambulatory Visit: Payer: Federal, State, Local not specified - PPO

## 2019-08-25 ENCOUNTER — Ambulatory Visit (INDEPENDENT_AMBULATORY_CARE_PROVIDER_SITE_OTHER): Payer: Federal, State, Local not specified - PPO | Admitting: Vascular Surgery

## 2019-08-25 ENCOUNTER — Encounter: Payer: Self-pay | Admitting: Vascular Surgery

## 2019-08-25 ENCOUNTER — Other Ambulatory Visit: Payer: Self-pay

## 2019-08-25 VITALS — BP 118/80 | HR 59 | Temp 97.6°F | Resp 18 | Ht 67.0 in | Wt 265.9 lb

## 2019-08-25 DIAGNOSIS — I83813 Varicose veins of bilateral lower extremities with pain: Secondary | ICD-10-CM

## 2019-08-25 HISTORY — PX: ENDOVENOUS ABLATION SAPHENOUS VEIN W/ LASER: SUR449

## 2019-08-25 NOTE — Progress Notes (Signed)
   Patient name: Stacy Salazar MRN: [id number] DOB: 1961/04/07 Sex: female  REASON FOR VISIT:   For laser ablation of the right great saphenous vein  HPI:   Stacy Salazar is a pleasant 58 y.o. female who underwent laser ablation of the left great saphenous vein on 08/11/2019.  She had 10-20 stabs on the left.  She presents now for staged right saphenous vein laser ablation.  Current Outpatient Medications  Medication Sig Dispense Refill  . CALCIUM CITRATE PO Take 500 mg by mouth 2 (two) times daily.    Marland Kitchen doxycycline (VIBRA-TABS) 100 MG tablet Take 1 tablet (100 mg total) by mouth daily. 30 tablet 3  . levothyroxine (SYNTHROID, LEVOTHROID) 200 MCG tablet TAKE 1 TABLET BY MOUTH ONCE DAILY IN THE MORNING ON AN EMPTY STOMACH 90 tablet 1  . levothyroxine (SYNTHROID, LEVOTHROID) 25 MCG tablet TAKE 1 TABLET BY MOUTH ONCE DAILY BEFORE BREAKFAST 90 tablet 2  . metroNIDAZOLE (METROGEL) 0.75 % gel Apply 1 application topically 2 (two) times daily. 45 g 4  . Multiple Vitamins-Minerals (MULTIVITAMIN ADULTS PO) Take by mouth.    Marland Kitchen rOPINIRole (REQUIP) 1 MG tablet Take 3 tablets (3 mg total) by mouth at bedtime. 90 tablet 2   No current facility-administered medications for this visit.     REVIEW OF SYSTEMS:  [X]  denotes positive finding, [ ]  denotes negative finding Vascular    Leg swelling    Cardiac    Chest pain or chest pressure:    Shortness of breath upon exertion:    Short of breath when lying flat:    Irregular heart rhythm:    Constitutional    Fever or chills:     PHYSICAL EXAM:   Vitals:   08/25/19 1045  BP: 118/80  Pulse: (!) 59  Resp: 18  Temp: 97.6 F (36.4 C)  TempSrc: Temporal  SpO2: 97%  Weight: 265 lb 14.4 oz (120.6 kg)  Height: 5\' 7"  (1.702 m)    DATA:   No new data  MEDICAL ISSUES:   LASER ABLATION RIGHT GREAT SAPHENOUS VEIN: The patient was brought to the exam room.  She was placed supine.  I examined the great saphenous vein with the SonoSite and it  appeared that I could cannulated this in the distal thigh.  The right leg was prepped and draped in the usual sterile fashion.  Under ultrasound guidance, after the skin was anesthetized, I cannulated the great saphenous vein with a micropuncture needle and a micropuncture sheath was introduced over a wire.  I advanced the J-wire to below the saphenofemoral junction.  The sheath was advanced over the wire.  The dilator was removed.  I then positioned the laser fiber at the end of the sheath which was approximately 2-1/2 cm from the saphenofemoral junction.  This was distal to the superficial epigastric vein.  The patient was placed in Trendelenburg after tumescent anesthesia was administered the entire length of the vein circumferentially.  Laser ablation was performed of the right great saphenous vein from this 2-1/2 cm distal to the saphenofemoral junction to above the knee.  A total of 1594 J of energy were used.  Patient tolerated procedure well.  Sterile dressing was applied.  Patient will return in 2 weeks for follow-up duplex.  Deitra Mayo [facility] [phone]

## 2019-08-25 NOTE — Progress Notes (Signed)
     Laser Ablation Procedure    Date: 08/25/2019   Stacy Salazar DOB:08-22-1961  Consent signed: Yes    Surgeon: Gae Gallop MD   Procedure: Laser Ablation: right Greater Saphenous Vein  BP 118/80 (BP Location: Left Arm, Patient Position: Sitting, Cuff Size: Large)   Pulse (!) 59   Temp 97.6 F (36.4 C) (Temporal)   Resp 18   Ht 5\' 7"  (1.702 m)   Wt 265 lb 14.4 oz (120.6 kg)   SpO2 97%   BMI 41.65 kg/m   Tumescent Anesthesia: 450 cc 0.9% NaCl with 50 cc Lidocaine HCL 1%  and 15 cc 8.4% NaHCO3  Local Anesthesia: 5 cc Lidocaine HCL and NaHCO3 (ratio 2:1)  15 watts continuous mode        Total energy: 1594 Joules   Total time: 1:45  Laser Fiber Ref. # [id number]      Lot # [id number]     Patient tolerated procedure well  Notes: Patient wore face mask.  All staff members wore facial masks and facial shields/goggles.    Description of Procedure:  After marking the course of the secondary varicosities, the patient was placed on the operating table in the supine position, and the right leg was prepped and draped in sterile fashion.   Local anesthetic was administered and under ultrasound guidance the saphenous vein was accessed with a micro needle and guide wire; then the mirco puncture sheath was placed.  A guide wire was inserted saphenofemoral junction , followed by a 5 french sheath.  The position of the sheath and then the laser fiber below the junction was confirmed using the ultrasound.  Tumescent anesthesia was administered along the course of the saphenous vein using ultrasound guidance. The patient was placed in Trendelenburg position and protective laser glasses were placed on patient and staff, and the laser was fired at 15 watts continuous mode advancing 1-27mm/second for a total of 1594 joules.      Steri strip was applied to the IV insertion site and ABD pads and thigh high compression stockings were applied.  Ace wrap bandages were applied at the top of the  saphenofemoral junction. Blood loss was less than 15 cc.  Discharge instructions reviewed with patient and hardcopy of discharge instructions given to patient to take home. The patient ambulated out of the operating room having tolerated the procedure well.

## 2019-08-26 ENCOUNTER — Ambulatory Visit: Payer: Federal, State, Local not specified - PPO

## 2019-08-30 ENCOUNTER — Ambulatory Visit (INDEPENDENT_AMBULATORY_CARE_PROVIDER_SITE_OTHER): Payer: Federal, State, Local not specified - PPO | Admitting: *Deleted

## 2019-08-30 ENCOUNTER — Other Ambulatory Visit: Payer: Self-pay

## 2019-08-30 DIAGNOSIS — Z23 Encounter for immunization: Secondary | ICD-10-CM

## 2019-09-07 ENCOUNTER — Telehealth (HOSPITAL_COMMUNITY): Payer: Self-pay | Admitting: *Deleted

## 2019-09-07 ENCOUNTER — Encounter: Payer: Self-pay | Admitting: *Deleted

## 2019-09-07 NOTE — Telephone Encounter (Signed)
The above patient or their representative was contacted and gave the following answers to these questions:         Do you have any of the following symptoms?n  Fever                    Cough                   Shortness of breath  Do  you have any of the following other symptoms? n   muscle pain         vomiting,        diarrhea        rash         weakness        red eye        abdominal pain         bruising          bruising or bleeding              joint pain           severe headache    Have you been in contact with someone who was or has been sick in the past 2 weeks?n  Yes                 Unsure                         Unable to assess   Does the person that you were in contact with have any of the following symptoms?   Cough         shortness of breath           muscle pain         vomiting,            diarrhea            rash            weakness           fever            red eye           abdominal pain           bruising  or  bleeding                joint pain                severe headache               Have you  or someone you have been in contact with traveled internationally in th last month?         If yes, which countries?   Have you  or someone you have been in contact with traveled outside [place] in th last month?         If yes, which state and city?   COMMENTS OR ACTION PLAN FOR THIS PATIENT:         

## 2019-09-08 ENCOUNTER — Ambulatory Visit (HOSPITAL_COMMUNITY)
Admission: RE | Admit: 2019-09-08 | Discharge: 2019-09-08 | Disposition: A | Discharge status: Home / Self Care | Payer: Federal, State, Local not specified - PPO | Source: Ambulatory Visit | Attending: Vascular Surgery | Admitting: Vascular Surgery

## 2019-09-08 ENCOUNTER — Ambulatory Visit (INDEPENDENT_AMBULATORY_CARE_PROVIDER_SITE_OTHER): Payer: Federal, State, Local not specified - PPO | Admitting: Vascular Surgery

## 2019-09-08 ENCOUNTER — Other Ambulatory Visit: Payer: Self-pay

## 2019-09-08 ENCOUNTER — Encounter: Payer: Self-pay | Admitting: Vascular Surgery

## 2019-09-08 VITALS — BP 136/76 | HR 58 | Temp 97.1°F | Resp 16

## 2019-09-08 DIAGNOSIS — I83813 Varicose veins of bilateral lower extremities with pain: Secondary | ICD-10-CM | POA: Diagnosis not present

## 2019-09-08 NOTE — Progress Notes (Signed)
   Patient name: Stacy Salazar MRN: [id number] DOB: 05/15/1961 Sex: female  REASON FOR VISIT:   Follow-up after laser ablation right great saphenous vein  HPI:   Stacy Salazar is a pleasant 58 y.o. female who underwent laser ablation of the right great saphenous vein on 08/25/2019.  The vein was laser to the distal thigh.  She had previously undergone laser ablation of the left great saphenous vein on 08/11/2019.  She comes in for a follow-up visit.    This is her last she states she is not having any pain in her legs.  She has no significant leg swelling.  She had some mild bruising which is resolved at this point.  She is no longer taking ibuprofen.  She wears her compression stockings as needed.  Current Outpatient Medications  Medication Sig Dispense Refill  . CALCIUM CITRATE PO Take 500 mg by mouth 2 (two) times daily.    Marland Kitchen doxycycline (VIBRA-TABS) 100 MG tablet Take 1 tablet (100 mg total) by mouth daily. 30 tablet 3  . levothyroxine (SYNTHROID, LEVOTHROID) 200 MCG tablet TAKE 1 TABLET BY MOUTH ONCE DAILY IN THE MORNING ON AN EMPTY STOMACH 90 tablet 1  . levothyroxine (SYNTHROID, LEVOTHROID) 25 MCG tablet TAKE 1 TABLET BY MOUTH ONCE DAILY BEFORE BREAKFAST 90 tablet 2  . metroNIDAZOLE (METROGEL) 0.75 % gel Apply 1 application topically 2 (two) times daily. 45 g 4  . Multiple Vitamins-Minerals (MULTIVITAMIN ADULTS PO) Take by mouth.    Marland Kitchen rOPINIRole (REQUIP) 1 MG tablet Take 3 tablets (3 mg total) by mouth at bedtime. 90 tablet 2   No current facility-administered medications for this visit.     REVIEW OF SYSTEMS:  [X]  denotes positive finding, [ ]  denotes negative finding Vascular    Leg swelling    Cardiac    Chest pain or chest pressure:    Shortness of breath upon exertion:    Short of breath when lying flat:    Irregular heart rhythm:    Constitutional    Fever or chills:     PHYSICAL EXAM:   Vitals:   09/08/19 1035  BP: 136/76  Pulse: (!) 58  Resp: 16  Temp: (!) 97.1  F (36.2 C)  TempSrc: Temporal  SpO2: 99%   GENERAL: The patient is a well-nourished female, in no acute distress. The vital signs are documented above. CARDIOVASCULAR: There is a regular rate and rhythm. PULMONARY: There is good air exchange bilaterally without wheezing or rales. Her incisions are all healing nicely. She has no significant leg swelling.  DATA:   VENOUS DUPLEX: I have independently interpreted her venous duplex scan today.  The right great saphenous vein is closed from 1.3 cm distal to the saphenofemoral junction down to the distal thigh.  There is no evidence of DVT.  MEDICAL ISSUES:   S/P LASER ABLATION RIGHT GREAT SAPHENOUS VEIN: Patient is doing well status post laser ablation of the right great saphenous vein.  The vein was successfully closed with no evidence of DVT.  I have encouraged her to stay as active as possible.  I encouraged her to wear compression stockings when she is going to be on her feet for a long time.  We will see her back as needed.  Deitra Mayo [facility] [phone]

## 2019-09-21 ENCOUNTER — Other Ambulatory Visit: Payer: Self-pay | Admitting: Family

## 2019-09-21 DIAGNOSIS — E039 Hypothyroidism, unspecified: Secondary | ICD-10-CM

## 2019-10-04 ENCOUNTER — Encounter: Payer: Federal, State, Local not specified - PPO | Admitting: Family

## 2019-10-12 ENCOUNTER — Other Ambulatory Visit: Payer: Self-pay | Admitting: Family

## 2019-10-12 DIAGNOSIS — G2581 Restless legs syndrome: Secondary | ICD-10-CM

## 2019-10-19 ENCOUNTER — Other Ambulatory Visit: Payer: Self-pay

## 2019-10-20 ENCOUNTER — Encounter: Payer: Self-pay | Admitting: Family

## 2019-10-20 ENCOUNTER — Ambulatory Visit: Payer: Federal, State, Local not specified - PPO | Admitting: Family

## 2019-10-20 VITALS — BP 133/84 | HR 55 | Temp 99.6°F | Ht 67.0 in | Wt 267.0 lb

## 2019-10-20 DIAGNOSIS — Z0000 Encounter for general adult medical examination without abnormal findings: Secondary | ICD-10-CM

## 2019-10-20 DIAGNOSIS — G2581 Restless legs syndrome: Secondary | ICD-10-CM

## 2019-10-20 DIAGNOSIS — Z01411 Encounter for gynecological examination (general) (routine) with abnormal findings: Secondary | ICD-10-CM

## 2019-10-20 DIAGNOSIS — Z01419 Encounter for gynecological examination (general) (routine) without abnormal findings: Secondary | ICD-10-CM

## 2019-10-20 DIAGNOSIS — Z0001 Encounter for general adult medical examination with abnormal findings: Secondary | ICD-10-CM | POA: Diagnosis not present

## 2019-10-20 DIAGNOSIS — E538 Deficiency of other specified B group vitamins: Secondary | ICD-10-CM

## 2019-10-20 DIAGNOSIS — G4733 Obstructive sleep apnea (adult) (pediatric): Secondary | ICD-10-CM

## 2019-10-20 DIAGNOSIS — E039 Hypothyroidism, unspecified: Secondary | ICD-10-CM | POA: Diagnosis not present

## 2019-10-20 DIAGNOSIS — D509 Iron deficiency anemia, unspecified: Secondary | ICD-10-CM

## 2019-10-20 DIAGNOSIS — E559 Vitamin D deficiency, unspecified: Secondary | ICD-10-CM

## 2019-10-20 DIAGNOSIS — Z9884 Bariatric surgery status: Secondary | ICD-10-CM

## 2019-10-20 MED ORDER — ROPINIROLE HCL 1 MG PO TABS: 4.0000 mg | ORAL_TABLET | Freq: Every day | ORAL | 6 refills | Status: DC

## 2019-10-20 NOTE — Patient Instructions (Signed)
Health Maintenance, Female Adopting a healthy lifestyle and getting preventive care are important in promoting health and wellness. Ask your health care provider about:  The right schedule for you to have regular tests and exams.  Things you can do on your own to prevent diseases and keep yourself healthy. What should I know about diet, weight, and exercise? Eat a healthy diet   Eat a diet that includes plenty of vegetables, fruits, low-fat dairy products, and lean protein.  Do not eat a lot of foods that are high in solid fats, added sugars, or sodium. Maintain a healthy weight Body mass index (BMI) is used to identify weight problems. It estimates body fat based on height and weight. Your health care provider can help determine your BMI and help you achieve or maintain a healthy weight. Get regular exercise Get regular exercise. This is one of the most important things you can do for your health. Most adults should:  Exercise for at least 150 minutes each week. The exercise should increase your heart rate and make you sweat (moderate-intensity exercise).  Do strengthening exercises at least twice a week. This is in addition to the moderate-intensity exercise.  Spend less time sitting. Even light physical activity can be beneficial. Watch cholesterol and blood lipids Have your blood tested for lipids and cholesterol at 58 years of age, then have this test every 5 years. Have your cholesterol levels checked more often if:  Your lipid or cholesterol levels are high.  You are older than 58 years of age.  You are at high risk for heart disease. What should I know about cancer screening? Depending on your health history and family history, you may need to have cancer screening at various ages. This may include screening for:  Breast cancer.  Cervical cancer.  Colorectal cancer.  Skin cancer.  Lung cancer. What should I know about heart disease, diabetes, and high blood  pressure? Blood pressure and heart disease  High blood pressure causes heart disease and increases the risk of stroke. This is more likely to develop in people who have high blood pressure readings, are of African descent, or are overweight.  Have your blood pressure checked: ? Every 3-5 years if you are 18-39 years of age. ? Every year if you are 40 years old or older. Diabetes Have regular diabetes screenings. This checks your fasting blood sugar level. Have the screening done:  Once every three years after age 40 if you are at a normal weight and have a low risk for diabetes.  More often and at a younger age if you are overweight or have a high risk for diabetes. What should I know about preventing infection? Hepatitis B If you have a higher risk for hepatitis B, you should be screened for this virus. Talk with your health care provider to find out if you are at risk for hepatitis B infection. Hepatitis C Testing is recommended for:  Everyone born from 1945 through 1965.  Anyone with known risk factors for hepatitis C. Sexually transmitted infections (STIs)  Get screened for STIs, including gonorrhea and chlamydia, if: ? You are sexually active and are younger than 58 years of age. ? You are older than 58 years of age and your health care provider tells you that you are at risk for this type of infection. ? Your sexual activity has changed since you were last screened, and you are at increased risk for chlamydia or gonorrhea. Ask your health care provider if   you are at risk.  Ask your health care provider about whether you are at high risk for HIV. Your health care provider may recommend a prescription medicine to help prevent HIV infection. If you choose to take medicine to prevent HIV, you should first get tested for HIV. You should then be tested every 3 months for as long as you are taking the medicine. Pregnancy  If you are about to stop having your period (premenopausal) and  you may become pregnant, seek counseling before you get pregnant.  Take 400 to 800 micrograms (mcg) of folic acid every day if you become pregnant.  Ask for birth control (contraception) if you want to prevent pregnancy. Osteoporosis and menopause Osteoporosis is a disease in which the bones lose minerals and strength with aging. This can result in bone fractures. If you are 65 years old or older, or if you are at risk for osteoporosis and fractures, ask your health care provider if you should:  Be screened for bone loss.  Take a calcium or vitamin D supplement to lower your risk of fractures.  Be given hormone replacement therapy (HRT) to treat symptoms of menopause. Follow these instructions at home: Lifestyle  Do not use any products that contain nicotine or tobacco, such as cigarettes, e-cigarettes, and chewing tobacco. If you need help quitting, ask your health care provider.  Do not use street drugs.  Do not share needles.  Ask your health care provider for help if you need support or information about quitting drugs. Alcohol use  Do not drink alcohol if: ? Your health care provider tells you not to drink. ? You are pregnant, may be pregnant, or are planning to become pregnant.  If you drink alcohol: ? Limit how much you use to 0-1 drink a day. ? Limit intake if you are breastfeeding.  Be aware of how much alcohol is in your drink. In the [place] one drink equals one 12 oz bottle of beer (355 mL), one 5 oz glass of wine (148 mL), or one 1 oz glass of hard liquor (44 mL). General instructions  Schedule regular health, dental, and eye exams.  Stay current with your vaccines.  Tell your health care provider if: ? You often feel depressed. ? You have ever been abused or do not feel safe at home. Summary  Adopting a healthy lifestyle and getting preventive care are important in promoting health and wellness.  Follow your health care provider's instructions about healthy  diet, exercising, and getting tested or screened for diseases.  Follow your health care provider's instructions on monitoring your cholesterol and blood pressure. This information is not intended to replace advice given to you by your health care provider. Make sure you discuss any questions you have with your health care provider. Document Released: 06/23/2011 Document Revised: 12/01/2018 Document Reviewed: 12/01/2018 [employer]vier Patient Education  2020 [employer]  

## 2019-10-20 NOTE — Progress Notes (Signed)
Subjective:    Patient ID: Stacy Salazar, female    DOB: 14-Nov-1961, 58 y.o.   MRN: [id number]  Chief Complaint  Patient presents with  . Gynecologic Exam   Pt presents to the office today for CPE with pap. Pt states she is having more fatigue over the last month. She states she has got a new part time job over the last month and is unsure if this is related to the fatigue or if it is related to anemia.   Pt has a hx of gastric bypass.  Gynecologic Exam The patient's pertinent negatives include no genital itching, genital lesions, genital odor, pelvic pain or vaginal discharge. The problem occurs intermittently. The patient is experiencing no pain. Pertinent negatives include no anorexia, constipation or diarrhea.  Thyroid Problem Presents for follow-up visit. Symptoms include dry skin and fatigue. Patient reports no constipation, diaphoresis or diarrhea. The symptoms have been stable.  Anemia Presents for follow-up visit. Symptoms include malaise/fatigue. There has been no anorexia or confusion.  OSA Uses CPAP every night.  RLS  Uses Requip 3 mg every night. States she still has movement on her legs especially if she wakes up  In the middle of the night. She has tried to cut all off caffeine except her morning coffee.     Review of Systems  Constitutional: Positive for fatigue and malaise/fatigue. Negative for diaphoresis.  Gastrointestinal: Negative for anorexia, constipation and diarrhea.  Genitourinary: Negative for pelvic pain and vaginal discharge.  Psychiatric/Behavioral: Negative for confusion.  All other systems reviewed and are negative.  Family History  Problem Relation Age of Onset  . Alcohol abuse Mother   . Cancer Mother   . Cirrhosis Mother   . Heart disease Father    Social History   Socioeconomic History  . Marital status: Divorced    Spouse name: Not on file  . Number of children: Not on file  . Years of education: Not on file  . Highest education  level: Not on file  Occupational History  . Not on file  Social Needs  . Financial resource strain: Not on file  . Food insecurity    Worry: Not on file    Inability: Not on file  . Transportation needs    Medical: Not on file    Non-medical: Not on file  Tobacco Use  . Smoking status: Never Smoker  . Smokeless tobacco: Never Used  Substance and Sexual Activity  . Alcohol use: Not Currently    Comment: Glass of wine monthly  . Drug use: Never  . Sexual activity: Not Currently  Lifestyle  . Physical activity    Days per week: Not on file    Minutes per session: Not on file  . Stress: Not on file  Relationships  . Social Herbalist on phone: Not on file    Gets together: Not on file    Attends religious service: Not on file    Active member of club or organization: Not on file    Attends meetings of clubs or organizations: Not on file    Relationship status: Not on file  Other Topics Concern  . Not on file  Social History Narrative  . Not on file        Objective:   Physical Exam Vitals signs reviewed.  Constitutional:      General: She is not in acute distress.    Appearance: She is well-developed. She is obese.  HENT:  Head: Normocephalic and atraumatic.     Right Ear: Tympanic membrane normal.     Left Ear: Tympanic membrane normal.  Eyes:     Pupils: Pupils are equal, round, and reactive to light.  Neck:     Musculoskeletal: Normal range of motion and neck supple.     Thyroid: No thyromegaly.  Cardiovascular:     Rate and Rhythm: Normal rate and regular rhythm.     Heart sounds: Normal heart sounds. No murmur.  Pulmonary:     Effort: Pulmonary effort is normal. No respiratory distress.     Breath sounds: Normal breath sounds. No wheezing.  Chest:     Breasts:        Right: No swelling, bleeding, inverted nipple, mass, nipple discharge, skin change or tenderness.        Left: No swelling, bleeding, inverted nipple, mass, nipple discharge,  skin change or tenderness.  Abdominal:     General: Bowel sounds are normal. There is no distension.     Palpations: Abdomen is soft.     Tenderness: There is no abdominal tenderness.  Genitourinary:    General: Normal vulva.     Comments: Bimanual exam- no adnexal masses or tenderness, ovaries nonpalpable   Cervix parous and pink- No discharge  Musculoskeletal: Normal range of motion.        General: No tenderness.  Skin:    General: Skin is warm and dry.  Neurological:     Mental Status: She is alert and oriented to person, place, and time.     Cranial Nerves: No cranial nerve deficit.     Deep Tendon Reflexes: Reflexes are normal and symmetric.  Psychiatric:        Behavior: Behavior normal.        Thought Content: Thought content normal.        Judgment: Judgment normal.       BP 133/84   Pulse (!) 55   Temp 99.6 F (37.6 C) (Temporal)   Ht '5\' 7"'  (1.702 m)   Wt 267 lb (121.1 kg)   BMI 41.82 kg/m      Assessment & Plan:  Mahiya Kercheval comes in today with chief complaint of Gynecologic Exam   Diagnosis and orders addressed:  1. Annual physical exam - Anemia Profile B - CMP14+EGFR - Lipid panel - TSH - VITAMIN D 25 Hydroxy (Vit-D Deficiency, Fractures) - IGP, Aptima HPV, rfx 16/18,45  2. Gynecologic exam normal - CMP14+EGFR - IGP, Aptima HPV, rfx 16/18,45  3. Hypothyroidism, unspecified type - CMP14+EGFR - TSH  4. Morbid obesity ([place]) - CMP14+EGFR  5. Iron deficiency anemia, unspecified iron deficiency anemia type - Anemia Profile B - CMP14+EGFR  6. OSA (obstructive sleep apnea) - CMP14+EGFR  7. Vitamin D deficiency - CMP14+EGFR - VITAMIN D 25 Hydroxy (Vit-D Deficiency, Fractures)  8. Vitamin B 12 deficiency - Anemia Profile B - CMP14+EGFR  9. Restless leg syndrome Will increase Requip to 4 mg from 3 mg Continue to avoid caffiene - CMP14+EGFR - rOPINIRole (REQUIP) 1 MG tablet; Take 4 tablets (4 mg total) by mouth at bedtime.   Dispense: 120 tablet; Refill: 6  10. Hx of gastric bypass - CMP14+EGFR   Labs pending Health Maintenance reviewed Diet and exercise encouraged  Follow up plan: 6 months    [name], FNP

## 2019-10-21 ENCOUNTER — Other Ambulatory Visit: Payer: Self-pay | Admitting: Family

## 2019-10-21 LAB — ANEMIA PROFILE B
Basophils Absolute: 0.1 10*3/uL (ref 0.0–0.2)
Basos: 1 %
EOS (ABSOLUTE): 0.1 10*3/uL (ref 0.0–0.4)
Eos: 3 %
Ferritin: 45 ng/mL (ref 15–150)
Folate: >20 ng/mL (ref >3.0)
Hematocrit: 38.4 % (ref 34.0–46.6)
Hemoglobin: 12.7 g/dL (ref 11.1–15.9)
Immature Grans (Abs): 0 10*3/uL (ref 0.0–0.1)
Immature Granulocytes: 0 %
Iron Saturation: 29 % (ref 15–55)
Iron: 95 ug/dL (ref 27–159)
Lymphocytes Absolute: 2.4 10*3/uL (ref 0.7–3.1)
Lymphs: 45 %
MCH: 29.8 pg (ref 26.6–33.0)
MCHC: 33.1 g/dL (ref 31.5–35.7)
MCV: 90 fL (ref 79–97)
Monocytes Absolute: 0.4 10*3/uL (ref 0.1–0.9)
Monocytes: 7 %
Neutrophils Absolute: 2.3 10*3/uL (ref 1.4–7.0)
Neutrophils: 44 %
Platelets: 292 10*3/uL (ref 150–450)
RBC: 4.26 x10E6/uL (ref 3.77–5.28)
RDW: 13.1 % (ref 11.7–15.4)
Retic Ct Pct: 1.7 % (ref 0.6–2.6)
Total Iron Binding Capacity: 326 ug/dL (ref 250–450)
UIBC: 231 ug/dL (ref 131–425)
Vitamin B-12: 987 pg/mL (ref 232–1245)
WBC: 5.3 10*3/uL (ref 3.4–10.8)

## 2019-10-21 LAB — TSH: TSH: 12.3 u[IU]/mL — ABNORMAL HIGH (ref 0.450–4.500)

## 2019-10-21 LAB — LIPID PANEL
Chol/HDL Ratio: 2.9 ratio (ref 0.0–4.4)
Cholesterol, Total: 184 mg/dL (ref 100–199)
HDL: 64 mg/dL (ref >39)
LDL Chol Calc (NIH): 95 mg/dL (ref 0–99)
Triglycerides: 148 mg/dL (ref 0–149)
VLDL Cholesterol Cal: 25 mg/dL (ref 5–40)

## 2019-10-21 LAB — CMP14+EGFR
ALT: 16 IU/L (ref 0–32)
AST: 21 IU/L (ref 0–40)
Albumin/Globulin Ratio: 2 (ref 1.2–2.2)
Albumin: 4.1 g/dL (ref 3.8–4.9)
Alkaline Phosphatase: 76 IU/L (ref 39–117)
BUN/Creatinine Ratio: 28 — ABNORMAL HIGH (ref 9–23)
BUN: 15 mg/dL (ref 6–24)
Bilirubin Total: 0.3 mg/dL (ref 0.0–1.2)
CO2: 24 mmol/L (ref 20–29)
Calcium: 9 mg/dL (ref 8.7–10.2)
Chloride: 105 mmol/L (ref 96–106)
Creatinine, Ser: 0.53 mg/dL — ABNORMAL LOW (ref 0.57–1.00)
GFR calc Af Amer: 121 mL/min/{1.73_m2} (ref >59)
GFR calc non Af Amer: 105 mL/min/{1.73_m2} (ref >59)
Globulin, Total: 2.1 g/dL (ref 1.5–4.5)
Glucose: 87 mg/dL (ref 65–99)
Potassium: 4.3 mmol/L (ref 3.5–5.2)
Sodium: 140 mmol/L (ref 134–144)
Total Protein: 6.2 g/dL (ref 6.0–8.5)

## 2019-10-21 LAB — VITAMIN D 25 HYDROXY (VIT D DEFICIENCY, FRACTURES): Vit D, 25-Hydroxy: 24.7 ng/mL — ABNORMAL LOW (ref 30.0–100.0)

## 2019-10-21 MED ORDER — VITAMIN D (ERGOCALCIFEROL) 1.25 MG (50000 UNIT) PO CAPS: 50000.0000 [IU] | ORAL_CAPSULE | ORAL | 3 refills | Status: DC

## 2019-10-24 ENCOUNTER — Other Ambulatory Visit: Payer: Self-pay | Admitting: *Deleted

## 2019-10-24 DIAGNOSIS — E039 Hypothyroidism, unspecified: Secondary | ICD-10-CM

## 2019-10-27 LAB — IGP, APTIMA HPV, RFX 16/18,45: HPV Aptima: NEGATIVE

## 2019-10-31 ENCOUNTER — Ambulatory Visit: Payer: Federal, State, Local not specified - PPO | Admitting: Family

## 2019-11-03 ENCOUNTER — Ambulatory Visit: Payer: Federal, State, Local not specified - PPO | Admitting: Family

## 2019-11-10 ENCOUNTER — Ambulatory Visit (INDEPENDENT_AMBULATORY_CARE_PROVIDER_SITE_OTHER): Payer: Federal, State, Local not specified - PPO | Admitting: "Endocrinology

## 2019-11-10 ENCOUNTER — Encounter: Payer: Self-pay | Admitting: "Endocrinology

## 2019-11-10 ENCOUNTER — Other Ambulatory Visit: Payer: Self-pay

## 2019-11-10 VITALS — BP 130/83 | HR 62 | Ht 67.0 in | Wt 269.0 lb

## 2019-11-10 DIAGNOSIS — E039 Hypothyroidism, unspecified: Secondary | ICD-10-CM | POA: Diagnosis not present

## 2019-11-10 MED ORDER — SYNTHROID 200 MCG PO TABS: 200.0000 ug | ORAL_TABLET | Freq: Every day | ORAL | 1 refills | Status: DC

## 2019-11-10 NOTE — Progress Notes (Signed)
Endocrinology Consult Note                                         11/10/2019, 5:27 PM   Stacy Salazar is a 58 y.o.-year-old female patient being seen in consultation for hypothyroidism referred by Stacy Salazar.   Past Medical History:  Diagnosis Date  . Varicose veins of bilateral lower extremities with other complications     Past Surgical History:  Procedure Laterality Date  . CARPAL TUNNEL RELEASE Left   . CESAREAN SECTION  1993  . CHOLECYSTECTOMY  2016  . ENDOVENOUS ABLATION SAPHENOUS VEIN W/ LASER Left 08/11/2019   endovenous laser ablation left gretaer saphenous vein and stab phlebectomy 10-20 incisions left leg by Stacy Carawayhris Dickson MD   . ENDOVENOUS ABLATION SAPHENOUS VEIN W/ LASER Right 08/25/2019   endovenous laser ablation right greater saphenous vein by Stacy Carawayhris Dickson MD   . GASTRIC BYPASS  12/23/2011    Social History   Socioeconomic History  . Marital status: Divorced    Spouse name: Not on file  . Number of children: Not on file  . Years of education: Not on file  . Highest education level: Not on file  Occupational History  . Not on file  Social Needs  . Financial resource strain: Not on file  . Food insecurity    Worry: Not on file    Inability: Not on file  . Transportation needs    Medical: Not on file    Non-medical: Not on file  Tobacco Use  . Smoking status: Never Smoker  . Smokeless tobacco: Never Used  Substance and Sexual Activity  . Alcohol use: Not Currently    Comment: Glass of wine monthly  . Drug use: Never  . Sexual activity: Not Currently  Lifestyle  . Physical activity    Days per week: Not on file    Minutes per session: Not on file  . Stress: Not on file  Relationships  . Social Musicianconnections    Talks on phone: Not on file    Gets together: Not on file    Attends religious service: Not on file    Active member of club or organization: Not on  file    Attends meetings of clubs or organizations: Not on file    Relationship status: Not on file  Other Topics Concern  . Not on file  Social History Narrative  . Not on file    Family History  Problem Relation Age of Onset  . Alcohol abuse Mother   . Cancer Mother   . Cirrhosis Mother   . Heart disease Father     Outpatient Encounter Medications as of 11/10/2019  Medication Sig  . [DISCONTINUED] levothyroxine (EUTHYROX) 50 MCG tablet Take 50 mcg by mouth daily before breakfast.  . CALCIUM CITRATE PO Take 500 mg by mouth 2 (two) times daily.  . metroNIDAZOLE (METROGEL) 0.75 % gel Apply 1 application topically 2 (  two) times daily.  . Multiple Vitamins-Minerals (MULTIVITAMIN ADULTS PO) Take by mouth.  Marland Kitchen rOPINIRole (REQUIP) 1 MG tablet Take 4 tablets (4 mg total) by mouth at bedtime.  Marland Kitchen SYNTHROID 200 MCG tablet Take 1 tablet (200 mcg total) by mouth daily before breakfast.  . Vitamin D, Ergocalciferol, (DRISDOL) 1.25 MG (50000 UT) CAPS capsule Take 1 capsule (50,000 Units total) by mouth every 7 (seven) days.  . [DISCONTINUED] EUTHYROX 25 MCG tablet TAKE 1 TABLET BY MOUTH ONCE DAILY BEFORE BREAKFAST  . [DISCONTINUED] levothyroxine (SYNTHROID) 200 MCG tablet TAKE 1 TABLET BY MOUTH ONCE DAILY IN THE MORNING ON AN EMPTY STOMACH   No facility-administered encounter medications on file as of 11/10/2019.     ALLERGIES: Allergies  Allergen Reactions  . Nsaids     Gastric bypass .   VACCINATION STATUS: Immunization History  Administered Date(s) Administered  . Influenza,inj,Quad PF,6+ Mos 10/29/2018, 08/30/2019  . Tdap 05/02/2019     HPI    Stacy Salazar  is a patient with the above medical history. she was diagnosed  with hypothyroidism at approximate age of 30 years. She was subsequently initiated on levothyroxine replacement.  she was given various doses of levothyroxine over the years, currently on 250 micrograms. she reports compliance to this medication:  Taking it  daily on empty stomach  with water, separated by >30 minutes before breakfast and other medications , and by at least 4 hours from   calcium, iron, PPIs, multivitamins .  Lab Results  Component Value Date   TSH 12.300 (H) 10/20/2019   TSH 0.685 05/02/2019   TSH 2.450 10/29/2018     Pt describes: Fluctuating body weight, fatigue, mood swings.  She also complains of hair loss.  Pt denies feeling nodules in neck, hoarseness, dysphagia/odynophagia, SOB with lying down.  she denies family history of  thyroid disorders, no family history of thyroid malignancy.  No history of  radiation therapy to head or neck, no history of thyroid surgery.  I reviewed her chart and she also has a history of obesity, status post gastric bypass in 2013.  She also has sleep apnea, restless leg syndrome.   ROS:  Constitutional: + Fluctuating body weight, + fatigue, + subjective hyperthermia Eyes: no blurry vision, no xerophthalmia ENT: no sore throat, no nodules palpated in throat, no dysphagia/odynophagia, no hoarseness Cardiovascular: no Chest Pain, no Shortness of Breath, no palpitations, no leg swelling Respiratory: no cough, no SOB Gastrointestinal: no Nausea/Vomiting/Diarhhea Musculoskeletal: no muscle/joint aches Skin: no rashes, + scalp hair loss. Neurological: no tremors, no numbness, no tingling, no dizziness Psychiatric: no depression, no anxiety   Physical Exam: BP 130/83   Pulse 62   Ht 5\' 7"  (1.702 m)   Wt 269 lb (122 kg)   BMI 42.13 kg/m  Wt Readings from Last 3 Encounters:  11/10/19 269 lb (122 kg)  10/20/19 267 lb (121.1 kg)  08/25/19 265 lb 14.4 oz (120.6 kg)    Constitutional:  Body mass index is 42.13 kg/m., not in acute distress, normal state of mind Eyes: PERRLA, EOMI, no exophthalmos Head: Scalp hair loss ENT: moist mucous membranes, no thyromegaly, no cervical lymphadenopathy Cardiovascular: normal precordial activity, Regular Rate and Rhythm, no  Murmur/Rubs/Gallops Respiratory:  adequate breathing efforts, no gross chest deformity, Clear to auscultation bilaterally Gastrointestinal: abdomen soft, Non -tender, No distension, Bowel Sounds present Musculoskeletal: no gross deformities, strength intact in all four extremities Skin: moist, warm, no rashes Neurological: no tremor with outstretched hands, Deep tendon reflexes normal in  all four extremities.   CMP ( most recent) CMP     Component Value Date/Time   NA 140 10/20/2019 1003   K 4.3 10/20/2019 1003   CL 105 10/20/2019 1003   CO2 24 10/20/2019 1003   GLUCOSE 87 10/20/2019 1003   BUN 15 10/20/2019 1003   CREATININE 0.53 (L) 10/20/2019 1003   CALCIUM 9.0 10/20/2019 1003   PROT 6.2 10/20/2019 1003   ALBUMIN 4.1 10/20/2019 1003   AST 21 10/20/2019 1003   ALT 16 10/20/2019 1003   ALKPHOS 76 10/20/2019 1003   BILITOT 0.3 10/20/2019 1003   GFRNONAA 105 10/20/2019 1003   GFRAA 121 10/20/2019 1003     Lipid Panel     Component Value Date/Time   CHOL 184 10/20/2019 1003   TRIG 148 10/20/2019 1003   HDL 64 10/20/2019 1003   CHOLHDL 2.9 10/20/2019 1003   LDLCALC 95 10/20/2019 1003   LABVLDL 25 10/20/2019 1003       Lab Results  Component Value Date   TSH 12.300 (H) 10/20/2019   TSH 0.685 05/02/2019   TSH 2.450 10/29/2018       ASSESSMENT: 1. Hypothyroidism 2.  Hair loss 3.  Vitamin D deficiency  PLAN:    Patient with long-standing hypothyroidism, on levothyroxine therapy. On physical exam , patient  does not  have  gross goiter, thyroid nodules, or neck compression symptoms. -She has some subtle signs of iatrogenic thyrotoxicosis. -She is on a particularly large dose of thyroid hormone, would try to switch it to brand-name Synthroid at 200 mcg for safety reasons.  - We discussed about correct intake of levothyroxine, at fasting, with water, separated by at least 30 minutes from breakfast, and separated by more than 4 hours from calcium, iron,  multivitamins, acid reflux medications (PPIs). -Patient is made aware of the fact that thyroid hormone replacement is needed for life, dose to be adjusted by periodic monitoring of thyroid function tests. - Will check thyroid tests before next visit: TSH, free T4 -Due to absence of clinical goiter, no need for thyroid ultrasound.  Given her clinical presentation with  Scalp hair loss, she is offered baseline total testosterone measurement to rule out hyperandrogenism. -She is advised to continue vitamin D 2 50,000 units weekly.  - Time spent with the patient: 60 minutes, of which >50% was spent in obtaining information about her symptoms, reviewing her previous labs, evaluations, and treatments, counseling her about her longstanding hypothyroidism, and developing a plan to confirm the diagnosis and long term treatment as necessary. Please refer to " Patient Self Inventory" in the Media  tab for reviewed elements of pertinent patient history.  Vito Berger participated in the discussions, expressed understanding, and voiced agreement with the above plans.  All questions were answered to her satisfaction. she is encouraged to contact clinic should she have any questions or concerns prior to her return visit.  Return in about 8 weeks (around 01/05/2020), or Fasting before 8AM, for Follow up with Pre-visit Labs.  Glade Lloyd, MD [facility] [address] [place], [place] [address] Phone: [phone]  Fax: [phone]   11/10/2019, 5:27 PM  This note was partially dictated with voice recognition software. Similar sounding words can be transcribed inadequately or may not  be corrected upon review.

## 2019-12-01 ENCOUNTER — Telehealth: Payer: Self-pay | Admitting: Family

## 2019-12-01 NOTE — Telephone Encounter (Signed)
Patient states she had an Rx written for CPAP last year and she can not remember where it was faxed to and is needing  A new sent to [place] [facility] along with her last sleep study that she states she had over 8 years ago in [place].

## 2019-12-02 NOTE — Telephone Encounter (Signed)
LMOVM that sleep study was not a document that we received from her former practice. She would need to sign a records release to have that sent to use or [facility].

## 2019-12-26 ENCOUNTER — Ambulatory Visit: Payer: Self-pay | Admitting: *Deleted

## 2019-12-28 ENCOUNTER — Other Ambulatory Visit: Payer: Self-pay

## 2019-12-28 ENCOUNTER — Ambulatory Visit: Payer: Federal, State, Local not specified - PPO | Attending: Internal Medicine

## 2019-12-28 DIAGNOSIS — Z20822 Contact with and (suspected) exposure to covid-19: Secondary | ICD-10-CM | POA: Diagnosis not present

## 2019-12-29 ENCOUNTER — Telehealth: Payer: Self-pay | Admitting: Family

## 2019-12-29 LAB — NOVEL CORONAVIRUS, NAA: SARS-CoV-2, NAA: NOT DETECTED

## 2019-12-29 NOTE — Telephone Encounter (Signed)
Spoke with pt and she is filling out insurance forms and wanted to know BS and A1C readings. Advised we only do A1C if you are diabetic or BS is elevated which hers was WNL at 87. Pt voiced understanding.

## 2020-01-02 ENCOUNTER — Other Ambulatory Visit: Payer: Self-pay | Admitting: "Endocrinology

## 2020-01-02 DIAGNOSIS — E039 Hypothyroidism, unspecified: Secondary | ICD-10-CM | POA: Diagnosis not present

## 2020-01-04 ENCOUNTER — Other Ambulatory Visit: Payer: Self-pay

## 2020-01-05 ENCOUNTER — Ambulatory Visit (INDEPENDENT_AMBULATORY_CARE_PROVIDER_SITE_OTHER): Payer: Federal, State, Local not specified - PPO | Admitting: "Endocrinology

## 2020-01-05 ENCOUNTER — Encounter: Payer: Self-pay | Admitting: "Endocrinology

## 2020-01-05 ENCOUNTER — Other Ambulatory Visit: Payer: Self-pay | Admitting: "Endocrinology

## 2020-01-05 DIAGNOSIS — E039 Hypothyroidism, unspecified: Secondary | ICD-10-CM | POA: Diagnosis not present

## 2020-01-05 DIAGNOSIS — E559 Vitamin D deficiency, unspecified: Secondary | ICD-10-CM

## 2020-01-05 LAB — THYROGLOBULIN ANTIBODY: Thyroglobulin Ab: 1 IU/mL (ref <=1)

## 2020-01-05 LAB — TESTOS,TOTAL,FREE AND SHBG (FEMALE)
Free Testosterone: 1.9 pg/mL (ref 0.1–6.4)
Sex Hormone Binding: 43 nmol/L (ref 14–73)
Testosterone, Total, LC-MS-MS: 18 ng/dL (ref 2–45)

## 2020-01-05 LAB — T4, FREE: Free T4: 1.6 ng/dL (ref 0.8–1.8)

## 2020-01-05 LAB — THYROID PEROXIDASE ANTIBODY: Thyroperoxidase Ab SerPl-aCnc: 32 IU/mL — ABNORMAL HIGH (ref <9)

## 2020-01-05 LAB — TSH: TSH: 1.5 mIU/L (ref 0.40–4.50)

## 2020-01-05 MED ORDER — VITAMIN D3 125 MCG (5000 UT) PO CAPS: 5000.0000 [IU] | ORAL_CAPSULE | Freq: Every day | ORAL | 0 refills | Status: DC

## 2020-01-05 NOTE — Progress Notes (Signed)
01/05/2020, 9:54 AM                                Endocrinology Telehealth Visit Follow up Note -During COVID -19 Pandemic  I connected with Stacy Salazar on 01/05/2020   by telephone and verified that I am speaking with the correct person using two identifiers. Stacy Salazar, Jan 03, 1961. she has verbally consented to this visit. All issues noted in this document were discussed and addressed. The format was not optimal for physical exam.   [name] is a 59 y.o.-year-old female patient being engaged in telehealth in follow-up after she was seen in consultation for hypothyroidism referred by Junie Spencer, FNP.   Past Medical History:  Diagnosis Date  . Varicose veins of bilateral lower extremities with other complications     Past Surgical History:  Procedure Laterality Date  . CARPAL TUNNEL RELEASE Left   . CESAREAN SECTION  1993  . CHOLECYSTECTOMY  2016  . ENDOVENOUS ABLATION SAPHENOUS VEIN W/ LASER Left 08/11/2019   endovenous laser ablation left gretaer saphenous vein and stab phlebectomy 10-20 incisions left leg by Cari Caraway MD   . ENDOVENOUS ABLATION SAPHENOUS VEIN W/ LASER Right 08/25/2019   endovenous laser ablation right greater saphenous vein by Cari Caraway MD   . GASTRIC BYPASS  12/23/2011    Social History   Socioeconomic History  . Marital status: Divorced    Spouse name: Not on file  . Number of children: Not on file  . Years of education: Not on file  . Highest education level: Not on file  Occupational History  . Not on file  Tobacco Use  . Smoking status: Never Smoker  . Smokeless tobacco: Never Used  Substance and Sexual Activity  . Alcohol use: Not Currently    Comment: Glass of wine monthly  . Drug use: Never  . Sexual activity: Not Currently  Other Topics Concern  . Not on file  Social History Narrative  . Not on file   Social Determinants of Health    Financial Resource Strain:   . Difficulty of Paying Living Expenses: Not on file  Food Insecurity:   . Worried About Programme researcher, broadcasting/film/video in the Last Year: Not on file  . Ran Out of Food in the Last Year: Not on file  Transportation Needs:   . Lack of Transportation (Medical): Not on file  . Lack of Transportation (Non-Medical): Not on file  Physical Activity:   . Days of Exercise per Week: Not on file  . Minutes of Exercise per Session: Not on file  Stress:   . Feeling of Stress : Not on file  Social Connections:   . Frequency of Communication with Friends and Family: Not on file  . Frequency of Social Gatherings with Friends and Family: Not on file  . Attends Religious Services: Not on file  . Active Member of Clubs or Organizations: Not on file  . Attends Banker Meetings: Not on file  .  Marital Status: Not on file    Family History  Problem Relation Age of Onset  . Alcohol abuse Mother   . Cancer Mother   . Cirrhosis Mother   . Heart disease Father     Outpatient Encounter Medications as of 01/05/2020  Medication Sig  . CALCIUM CITRATE PO Take 500 mg by mouth 2 (two) times daily.  . Cholecalciferol (VITAMIN D3) 125 MCG (5000 UT) CAPS Take 1 capsule (5,000 Units total) by mouth daily.  . metroNIDAZOLE (METROGEL) 0.75 % gel Apply 1 application topically 2 (two) times daily.  . Multiple Vitamins-Minerals (MULTIVITAMIN ADULTS PO) Take by mouth.  Marland Kitchen rOPINIRole (REQUIP) 1 MG tablet Take 4 tablets (4 mg total) by mouth at bedtime.  Marland Kitchen SYNTHROID 200 MCG tablet Take 1 tablet (200 mcg total) by mouth daily before breakfast.  . [DISCONTINUED] Vitamin D, Ergocalciferol, (DRISDOL) 1.25 MG (50000 UT) CAPS capsule Take 1 capsule (50,000 Units total) by mouth every 7 (seven) days.   No facility-administered encounter medications on file as of 01/05/2020.    ALLERGIES: Allergies  Allergen Reactions  . Nsaids     Gastric bypass .   VACCINATION STATUS: Immunization  History  Administered Date(s) Administered  . Influenza,inj,Quad PF,6+ Mos 10/29/2018, 08/30/2019  . Tdap 05/02/2019     HPI    Stacy Salazar  is a patient with the above medical history. she was diagnosed  with hypothyroidism at approximate age of 59 years. She was subsequently initiated on levothyroxine replacement.  she was given various doses of levothyroxine over the years up to 250 mcg daily.  During her last visit she was switched to Synthroid at 200 mcg p.o. daily.  Her previsit labs consistent with appropriate replacement she has no new complaints today. -She reports compliance to her medications.  Lab Results  Component Value Date   TSH 1.50 01/02/2020   TSH 12.300 (H) 10/20/2019   TSH 0.685 05/02/2019   TSH 2.450 10/29/2018   FREET4 1.6 01/02/2020    Pt denies feeling nodules in neck, hoarseness, dysphagia/odynophagia, SOB with lying down.  she denies family history of  thyroid disorders, no family history of thyroid malignancy.  No history of  radiation therapy to head or neck, no history of thyroid surgery.  I reviewed her chart and she also has a history of obesity, status post gastric bypass in 2013.  She also has sleep apnea, restless leg syndrome.   ROS: Limited as above.   Physical Exam: There were no vitals taken for this visit. Wt Readings from Last 3 Encounters:  11/10/19 269 lb (122 kg)  10/20/19 267 lb (121.1 kg)  08/25/19 265 lb 14.4 oz (120.6 kg)      CMP ( most recent) CMP     Component Value Date/Time   NA 140 10/20/2019 1003   K 4.3 10/20/2019 1003   CL 105 10/20/2019 1003   CO2 24 10/20/2019 1003   GLUCOSE 87 10/20/2019 1003   BUN 15 10/20/2019 1003   CREATININE 0.53 (L) 10/20/2019 1003   CALCIUM 9.0 10/20/2019 1003   PROT 6.2 10/20/2019 1003   ALBUMIN 4.1 10/20/2019 1003   AST 21 10/20/2019 1003   ALT 16 10/20/2019 1003   ALKPHOS 76 10/20/2019 1003   BILITOT 0.3 10/20/2019 1003   GFRNONAA 105 10/20/2019 1003   GFRAA 121  10/20/2019 1003     Lipid Panel     Component Value Date/Time   CHOL 184 10/20/2019 1003   TRIG 148 10/20/2019 1003  HDL 64 10/20/2019 1003   CHOLHDL 2.9 10/20/2019 1003   LDLCALC 95 10/20/2019 1003   LABVLDL 25 10/20/2019 1003       Lab Results  Component Value Date   TSH 1.50 01/02/2020   TSH 12.300 (H) 10/20/2019   TSH 0.685 05/02/2019   TSH 2.450 10/29/2018   FREET4 1.6 01/02/2020       ASSESSMENT: 1. Hypothyroidism 2.  Hair loss 3.  Vitamin D deficiency  PLAN:    Patient with long-standing hypothyroidism.  Her current thyroid hormone replacement with Synthroid 200 mcg is consistent with appropriate replacement.  - We discussed about the correct intake of her thyroid hormone, on empty stomach at fasting, with water, separated by at least 30 minutes from breakfast and other medications,  and separated by more than 4 hours from calcium, iron, multivitamins, acid reflux medications (PPIs). -Patient is made aware of the fact that thyroid hormone replacement is needed for life, dose to be adjusted by periodic monitoring of thyroid function tests. -Due to absence of clinical goiter, no need for thyroid ultrasound.  Given her clinical presentation with  Scalp hair loss, she is offered baseline total testosterone measurement to rule out hyperandrogenism-total testosterone is still in process in her labs. -She is advised to continue vitamin D32 5,000 units daily x90 days.    She will be called for a phone visit if her total testosterone is abnormal.    - Time spent on this patient care encounter:  25 minutes of which 50% was spent in  counseling and the rest reviewing  her current and  previous labs / studies and medications  doses and developing a plan for long term care. Stacy Salazar  participated in the discussions, expressed understanding, and voiced agreement with the above plans.  All questions were answered to her satisfaction. she is encouraged to contact clinic  should she have any questions or concerns prior to her return visit.   Return in about 4 months (around 05/04/2020) for Follow up with Pre-visit Labs.  Marquis Lunch, MD [facility] [address] [place], [place] [address] Phone: [phone]  Fax: [phone]   01/05/2020, 9:54 AM  This note was partially dictated with voice recognition software. Similar sounding words can be transcribed inadequately or may not  be corrected upon review.

## 2020-01-06 ENCOUNTER — Ambulatory Visit (INDEPENDENT_AMBULATORY_CARE_PROVIDER_SITE_OTHER): Payer: Federal, State, Local not specified - PPO | Admitting: *Deleted

## 2020-01-06 ENCOUNTER — Other Ambulatory Visit: Payer: Self-pay

## 2020-01-06 DIAGNOSIS — Z23 Encounter for immunization: Secondary | ICD-10-CM | POA: Diagnosis not present

## 2020-01-06 NOTE — Progress Notes (Signed)
Pt notified of results

## 2020-01-06 NOTE — Progress Notes (Signed)
[name], Would you call and inform this patient that her Testosterone level is normal, and no additional recommendations?

## 2020-01-23 ENCOUNTER — Other Ambulatory Visit: Payer: Self-pay | Admitting: Family

## 2020-01-23 DIAGNOSIS — L719 Rosacea, unspecified: Secondary | ICD-10-CM

## 2020-03-15 DIAGNOSIS — Z23 Encounter for immunization: Secondary | ICD-10-CM | POA: Diagnosis not present

## 2020-03-19 ENCOUNTER — Other Ambulatory Visit: Payer: Self-pay

## 2020-03-19 ENCOUNTER — Ambulatory Visit (INDEPENDENT_AMBULATORY_CARE_PROVIDER_SITE_OTHER): Payer: Federal, State, Local not specified - PPO

## 2020-03-19 DIAGNOSIS — Z23 Encounter for immunization: Secondary | ICD-10-CM | POA: Diagnosis not present

## 2020-03-19 NOTE — Progress Notes (Signed)
Shingles vaccine given to right deltoid.  Patient tolerated well.  

## 2020-03-22 ENCOUNTER — Telehealth: Payer: Self-pay | Admitting: Family

## 2020-03-22 DIAGNOSIS — G4733 Obstructive sleep apnea (adult) (pediatric): Secondary | ICD-10-CM

## 2020-03-22 NOTE — Telephone Encounter (Signed)
Pt called stating [facility] is supposed to be faxing [place] patients sleep studies for sleep apnea. Pt wanted to call and make [place] aware so that we can be looking for it. Says its so she can get supplies ordered for her sleep apnea machine.   Wants order faxed to [facility] Fax# [phone]

## 2020-03-27 DIAGNOSIS — Z1231 Encounter for screening mammogram for malignant neoplasm of breast: Secondary | ICD-10-CM | POA: Diagnosis not present

## 2020-03-27 NOTE — Telephone Encounter (Signed)
Pt came by the office to sign release for sleep apena records today. Pt wants a call when the rx has been faxed to [facility].

## 2020-03-28 NOTE — Telephone Encounter (Signed)
[email or web address]

## 2020-03-28 NOTE — Telephone Encounter (Signed)
Patient states this is the only sleep study she has had. She only needs the order for the head gear for the machine. Please advise.

## 2020-03-29 NOTE — Addendum Note (Signed)
Addended by: Jannifer Rodney A on: 03/29/2020 03:46 PM   Modules accepted: Orders

## 2020-03-29 NOTE — Telephone Encounter (Signed)
CPAP supplies rx written and referral placed for sleep study.

## 2020-03-29 NOTE — Telephone Encounter (Signed)
Faxed order to [facility]

## 2020-04-02 ENCOUNTER — Other Ambulatory Visit: Payer: Self-pay | Admitting: "Endocrinology

## 2020-04-06 ENCOUNTER — Telehealth: Payer: Self-pay | Admitting: Vascular Surgery

## 2020-04-06 NOTE — Telephone Encounter (Signed)
Called patient to discuss bill.  Left voicemail for patient to call back.

## 2020-04-07 DIAGNOSIS — Z23 Encounter for immunization: Secondary | ICD-10-CM | POA: Diagnosis not present

## 2020-04-10 ENCOUNTER — Telehealth: Payer: Self-pay | Admitting: Family

## 2020-04-16 NOTE — Telephone Encounter (Signed)
Spoke to patient and informed of normal results.  

## 2020-04-24 DIAGNOSIS — D649 Anemia, unspecified: Secondary | ICD-10-CM | POA: Diagnosis not present

## 2020-04-24 DIAGNOSIS — G4733 Obstructive sleep apnea (adult) (pediatric): Secondary | ICD-10-CM | POA: Diagnosis not present

## 2020-04-24 DIAGNOSIS — G2581 Restless legs syndrome: Secondary | ICD-10-CM | POA: Diagnosis not present

## 2020-04-24 DIAGNOSIS — E039 Hypothyroidism, unspecified: Secondary | ICD-10-CM | POA: Diagnosis not present

## 2020-04-25 DIAGNOSIS — D649 Anemia, unspecified: Secondary | ICD-10-CM | POA: Diagnosis not present

## 2020-05-02 DIAGNOSIS — E559 Vitamin D deficiency, unspecified: Secondary | ICD-10-CM | POA: Diagnosis not present

## 2020-05-02 DIAGNOSIS — E039 Hypothyroidism, unspecified: Secondary | ICD-10-CM | POA: Diagnosis not present

## 2020-05-03 LAB — VITAMIN D 25 HYDROXY (VIT D DEFICIENCY, FRACTURES): Vit D, 25-Hydroxy: 23 ng/mL — ABNORMAL LOW (ref 30–100)

## 2020-05-03 LAB — T4, FREE: Free T4: 1.3 ng/dL (ref 0.8–1.8)

## 2020-05-03 LAB — TSH: TSH: 3.97 mIU/L (ref 0.40–4.50)

## 2020-05-09 ENCOUNTER — Encounter: Payer: Self-pay | Admitting: "Endocrinology

## 2020-05-09 ENCOUNTER — Ambulatory Visit: Payer: Federal, State, Local not specified - PPO | Admitting: "Endocrinology

## 2020-05-09 VITALS — BP 133/84 | HR 59 | Ht 67.0 in | Wt 273.2 lb

## 2020-05-09 DIAGNOSIS — E039 Hypothyroidism, unspecified: Secondary | ICD-10-CM

## 2020-05-09 DIAGNOSIS — E559 Vitamin D deficiency, unspecified: Secondary | ICD-10-CM | POA: Diagnosis not present

## 2020-05-09 NOTE — Progress Notes (Signed)
05/09/2020, 9:55 AM                Endocrinology follow-up note    Stacy Salazar is a 59 y.o.-year-old female patient being seen in follow-up for  Hypothyroidism.  PCP: Junie Spencer, FNP.   Past Medical History:  Diagnosis Date  . Varicose veins of bilateral lower extremities with other complications     Past Surgical History:  Procedure Laterality Date  . CARPAL TUNNEL RELEASE Left   . CESAREAN SECTION  1993  . CHOLECYSTECTOMY  2016  . ENDOVENOUS ABLATION SAPHENOUS VEIN W/ LASER Left 08/11/2019   endovenous laser ablation left gretaer saphenous vein and stab phlebectomy 10-20 incisions left leg by Cari Caraway MD   . ENDOVENOUS ABLATION SAPHENOUS VEIN W/ LASER Right 08/25/2019   endovenous laser ablation right greater saphenous vein by Cari Caraway MD   . GASTRIC BYPASS  12/23/2011    Social History   Socioeconomic History  . Marital status: Divorced    Spouse name: Not on file  . Number of children: Not on file  . Years of education: Not on file  . Highest education level: Not on file  Occupational History  . Not on file  Tobacco Use  . Smoking status: Never Smoker  . Smokeless tobacco: Never Used  Substance and Sexual Activity  . Alcohol use: Not Currently    Comment: Glass of wine monthly  . Drug use: Never  . Sexual activity: Not Currently  Other Topics Concern  . Not on file  Social History Narrative  . Not on file   Social Determinants of Health   Financial Resource Strain:   . Difficulty of Paying Living Expenses:   Food Insecurity:   . Worried About Programme researcher, broadcasting/film/video in the Last Year:   . Barista in the Last Year:   Transportation Needs:   . Freight forwarder (Medical):   Marland Kitchen Lack of Transportation (Non-Medical):   Physical Activity:   . Days of Exercise per Week:   . Minutes of Exercise per Session:   Stress:   . Feeling of Stress :   Social  Connections:   . Frequency of Communication with Friends and Family:   . Frequency of Social Gatherings with Friends and Family:   . Attends Religious Services:   . Active Member of Clubs or Organizations:   . Attends Banker Meetings:   Marland Kitchen Marital Status:     Family History  Problem Relation Age of Onset  . Alcohol abuse Mother   . Cancer Mother   . Cirrhosis Mother   . Heart disease Father     Outpatient Encounter Medications as of 05/09/2020  Medication Sig  . pramipexole (MIRAPEX) 0.25 MG tablet Take 2 tablets by mouth at bedtime.  Marland Kitchen CALCIUM CITRATE PO Take 500 mg by mouth 2 (two) times daily.  . Cholecalciferol (VITAMIN D3) 125 MCG (5000 UT) CAPS Take 1 capsule (5,000 Units total) by mouth daily.  . metroNIDAZOLE (METROGEL) 0.75 % gel APPLY  THIN LAYER TOPICALLY  TWICE DAILY  . Multiple Vitamins-Minerals (MULTIVITAMIN ADULTS PO) Take by mouth.  . SYNTHROID 200 MCG tablet TAKE 1 TABLET BY MOUTH ONCE DAILY BEFORE BREAKFAST  . [DISCONTINUED] rOPINIRole (REQUIP) 1 MG tablet Take 4 tablets (4 mg total) by mouth at bedtime.   No facility-administered encounter medications on file as of 05/09/2020.    ALLERGIES: Allergies  Allergen Reactions  . Nsaids     Gastric bypass .   VACCINATION STATUS: Immunization History  Administered Date(s) Administered  . Influenza,inj,Quad PF,6+ Mos 10/29/2018, 08/30/2019  . Tdap 05/02/2019  . Zoster Recombinat (Shingrix) 01/06/2020, 03/19/2020     HPI    Stacy Salazar  is a patient with the above medical history. she was diagnosed  with hypothyroidism at approximate age of 30 years. She was subsequently initiated on levothyroxine replacement.  she was given various doses of levothyroxine, which was adjusted to 200 mcg daily during her last visit.  She has no new complaints today.  She reports that she is feeling better, has more consistent energy.   -She reports compliance to her medications.   Pt denies feeling nodules in  neck, hoarseness, dysphagia/odynophagia, SOB with lying down.  she denies family history of  thyroid disorders, no family history of thyroid malignancy.  No history of  radiation therapy to head or neck, no history of thyroid surgery.  I reviewed her chart and she also has a history of obesity, status post gastric bypass in 2013.  She also has sleep apnea, restless leg syndrome.   ROS: Limited as above.   Physical Exam: BP 133/84   Pulse (!) 59   Ht 5\' 7"  (1.702 m)   Wt 273 lb 3.2 oz (123.9 kg)   BMI 42.79 kg/m  Wt Readings from Last 3 Encounters:  05/09/20 273 lb 3.2 oz (123.9 kg)  11/10/19 269 lb (122 kg)  10/20/19 267 lb (121.1 kg)      CMP ( most recent) CMP     Component Value Date/Time   NA 140 10/20/2019 1003   K 4.3 10/20/2019 1003   CL 105 10/20/2019 1003   CO2 24 10/20/2019 1003   GLUCOSE 87 10/20/2019 1003   BUN 15 10/20/2019 1003   CREATININE 0.53 (L) 10/20/2019 1003   CALCIUM 9.0 10/20/2019 1003   PROT 6.2 10/20/2019 1003   ALBUMIN 4.1 10/20/2019 1003   AST 21 10/20/2019 1003   ALT 16 10/20/2019 1003   ALKPHOS 76 10/20/2019 1003   BILITOT 0.3 10/20/2019 1003   GFRNONAA 105 10/20/2019 1003   GFRAA 121 10/20/2019 1003     Lipid Panel     Component Value Date/Time   CHOL 184 10/20/2019 1003   TRIG 148 10/20/2019 1003   HDL 64 10/20/2019 1003   CHOLHDL 2.9 10/20/2019 1003   LDLCALC 95 10/20/2019 1003   LABVLDL 25 10/20/2019 1003       Lab Results  Component Value Date   TSH 3.97 05/02/2020   TSH 1.50 01/02/2020   TSH 12.300 (H) 10/20/2019   TSH 0.685 05/02/2019   TSH 2.450 10/29/2018   FREET4 1.3 05/02/2020   FREET4 1.6 01/02/2020       ASSESSMENT: 1. Hypothyroidism 2.  Vitamin D deficiency  PLAN:    Patient with long-standing hypothyroidism.  Her previsit thyroid function tests are consistent with appropriate replacement.      - We discussed about the correct intake of her thyroid hormone, on empty stomach at fasting, with  water, separated by at least 30 minutes  from breakfast and other medications,  and separated by more than 4 hours from calcium, iron, multivitamins, acid reflux medications (PPIs). -Patient is made aware of the fact that thyroid hormone replacement is needed for life, dose to be adjusted by periodic monitoring of thyroid function tests.   -Due to absence of clinical goiter, no need for thyroid ultrasound.  Given her clinical presentation with  Scalp hair loss, she is offered baseline total testosterone measurement to rule out hyperandrogenism-total testosterone is normal at 18.  She does not have hyperandrogenism.    -Her 25-hydroxy vitamin D is still low at 53, he is advised to double her vitamin D 3 supplement to 10,000 daily for 30 days, and will have repeat vitamin D measurement before her next visit.  She is advised to maintain close follow-up with her PCP [name], FNP.      - Time spent on this patient care encounter:  20 minutes of which 50% was spent in  counseling and the rest reviewing  her current and  previous labs / studies and medications  doses and developing a plan for long term care. Vito Berger  participated in the discussions, expressed understanding, and voiced agreement with the above plans.  All questions were answered to her satisfaction. she is encouraged to contact clinic should she have any questions or concerns prior to her return visit.   Return in about 6 months (around 11/09/2020) for F/U with Pre-visit Labs.  Glade Lloyd, MD [facility] [address] [place], [place] [address] Phone: [phone]  Fax: [phone]   05/09/2020, 9:55 AM  This note was partially dictated with voice recognition software. Similar sounding words can be transcribed inadequately or may not  be corrected upon review.

## 2020-05-09 NOTE — Addendum Note (Signed)
Addended by: Jannifer Franklin A on: 05/09/2020 10:23 AM   Modules accepted: Orders

## 2020-05-30 ENCOUNTER — Other Ambulatory Visit: Payer: Self-pay | Admitting: Family

## 2020-05-30 DIAGNOSIS — L719 Rosacea, unspecified: Secondary | ICD-10-CM

## 2020-06-04 ENCOUNTER — Other Ambulatory Visit: Payer: Self-pay

## 2020-06-04 DIAGNOSIS — L719 Rosacea, unspecified: Secondary | ICD-10-CM

## 2020-06-27 DIAGNOSIS — Z79899 Other long term (current) drug therapy: Secondary | ICD-10-CM | POA: Diagnosis not present

## 2020-06-27 DIAGNOSIS — E559 Vitamin D deficiency, unspecified: Secondary | ICD-10-CM | POA: Diagnosis not present

## 2020-07-03 DIAGNOSIS — G4733 Obstructive sleep apnea (adult) (pediatric): Secondary | ICD-10-CM | POA: Diagnosis not present

## 2020-07-03 DIAGNOSIS — E559 Vitamin D deficiency, unspecified: Secondary | ICD-10-CM | POA: Diagnosis not present

## 2020-07-03 DIAGNOSIS — D649 Anemia, unspecified: Secondary | ICD-10-CM | POA: Diagnosis not present

## 2020-07-03 DIAGNOSIS — G2581 Restless legs syndrome: Secondary | ICD-10-CM | POA: Diagnosis not present

## 2020-07-12 ENCOUNTER — Ambulatory Visit: Payer: Federal, State, Local not specified - PPO | Admitting: Family

## 2020-07-12 ENCOUNTER — Encounter: Payer: Self-pay | Admitting: Family

## 2020-07-12 ENCOUNTER — Other Ambulatory Visit: Payer: Self-pay

## 2020-07-12 VITALS — BP 131/79 | HR 78 | Temp 97.4°F | Ht 67.0 in | Wt 267.2 lb

## 2020-07-12 DIAGNOSIS — G4733 Obstructive sleep apnea (adult) (pediatric): Secondary | ICD-10-CM | POA: Diagnosis not present

## 2020-07-12 DIAGNOSIS — E559 Vitamin D deficiency, unspecified: Secondary | ICD-10-CM

## 2020-07-12 DIAGNOSIS — L918 Other hypertrophic disorders of the skin: Secondary | ICD-10-CM

## 2020-07-12 DIAGNOSIS — E039 Hypothyroidism, unspecified: Secondary | ICD-10-CM

## 2020-07-12 DIAGNOSIS — L719 Rosacea, unspecified: Secondary | ICD-10-CM | POA: Diagnosis not present

## 2020-07-12 DIAGNOSIS — D509 Iron deficiency anemia, unspecified: Secondary | ICD-10-CM | POA: Diagnosis not present

## 2020-07-12 DIAGNOSIS — Z9884 Bariatric surgery status: Secondary | ICD-10-CM

## 2020-07-12 MED ORDER — METRONIDAZOLE 0.75 % EX GEL: CUTANEOUS | 0 refills | Status: DC

## 2020-07-12 MED ORDER — DOXYCYCLINE HYCLATE 100 MG PO TABS: 100.0000 mg | ORAL_TABLET | Freq: Two times a day (BID) | ORAL | 0 refills | Status: AC

## 2020-07-12 NOTE — Progress Notes (Signed)
Subjective:    Patient ID: Stacy Salazar, female    DOB: July 18, 1961, 59 y.o.   MRN: [id number]  Chief Complaint  Patient presents with  . Medical Management of Chronic Issues  . Nevus    on baco of leg    HPI Pt presents to the office today for chronic follow up. Pt is followed by Neurologists and had a sleep study that showed her OSA had improved. However, states if she does not use her CPAP she feels very fatigued and has a headache.   Pt has a hx of gastric bypass.   She has rosacea and uses metrogel twice a day, but has been without it for a few weeks and has noticed increased redness.   She is followed by Endo for hypothyroid every 6 months.    She has RLS and her requip was changed to Mirapex. States this is working better.    She is also complaining of skin tags on her right inner thigh and under bilateral breast that have become red and irritated.    Review of Systems  Skin: Positive for rash.  All other systems reviewed and are negative.      Objective:   Physical Exam Vitals reviewed.  Constitutional:      General: She is not in acute distress.    Appearance: She is well-developed. She is obese.  HENT:     Head: Normocephalic and atraumatic.     Right Ear: Tympanic membrane normal.     Left Ear: Tympanic membrane normal.  Eyes:     Pupils: Pupils are equal, round, and reactive to light.  Neck:     Thyroid: No thyromegaly.  Cardiovascular:     Rate and Rhythm: Normal rate and regular rhythm.     Heart sounds: Normal heart sounds. No murmur heard.   Pulmonary:     Effort: Pulmonary effort is normal. No respiratory distress.     Breath sounds: Normal breath sounds. No wheezing.  Abdominal:     General: Bowel sounds are normal. There is no distension.     Palpations: Abdomen is soft.     Tenderness: There is no abdominal tenderness.  Musculoskeletal:        General: No tenderness. Normal range of motion.     Cervical back: Normal range of motion and  neck supple.  Skin:    General: Skin is warm and dry.          Comments: Large skin tag on inner right thigh, 4 small skin tag under bilateral breast. All skin tags erythemas.   Neurological:     Mental Status: She is alert and oriented to person, place, and time.     Cranial Nerves: No cranial nerve deficit.     Deep Tendon Reflexes: Reflexes are normal and symmetric.  Psychiatric:        Behavior: Behavior normal.        Thought Content: Thought content normal.        Judgment: Judgment normal.    Area cleaned and skin removed on right inner thigh, and 4 under bilateral breast. Pt tolerated well.    BP 131/79   Pulse 78   Temp (!) 97.4 F (36.3 C) (Temporal)   Ht '5\' 7"'  (1.702 m)   Wt 267 lb 3.2 oz (121.2 kg)   SpO2 98%   BMI 41.85 kg/m      Assessment & Plan:  Stacy Salazar comes in today with chief complaint of Medical Management  of Chronic Issues and Nevus (on baco of leg)   Diagnosis and orders addressed:  1. Rosacea Avoid caffeine  - metroNIDAZOLE (METROGEL) 0.75 % gel; APPLY  THIN LAYER TOPICALLY TWICE DAILY  Dispense: 45 g; Refill: 0 - CMP14+EGFR - doxycycline (VIBRA-TABS) 100 MG tablet; Take 1 tablet (100 mg total) by mouth 2 (two) times daily.  Dispense: 60 tablet; Refill: 0  2. OSA (obstructive sleep apnea) - CMP14+EGFR  3. Hypothyroidism, unspecified type - CMP14+EGFR  4. Hx of gastric bypass - CMP14+EGFR  5. Morbid obesity (HCC) - CMP14+EGFR  6. Vitamin D deficiency - CMP14+EGFR  7. Iron deficiency anemia, unspecified iron deficiency anemia type - CMP14+EGFR - Iron  8. Skin tags, multiple acquired Keep pressure gauze    Labs pending Health Maintenance reviewed Diet and exercise encouraged  Follow up plan: 6 months   [name], FNP

## 2020-07-12 NOTE — Patient Instructions (Signed)
Skin Tag, Adult  A skin tag (acrochordon) is a soft, extra growth of skin. Most skin tags are flesh-colored and rarely bigger than a pencil eraser. They commonly form near areas where there are folds in the skin, such as the armpit or groin. Skin tags are not dangerous, and they do not spread from person to person (are not contagious). You may have one skin tag or several. Skin tags do not require treatment. However, your health care provider may recommend removal of a skin tag if it:  Gets irritated from clothing.  Bleeds.  Is visible and unsightly. Your health care provider can remove skin tags with a simple surgical procedure or a procedure that involves freezing the skin tag. Follow these instructions at home:  Watch for any changes in your skin tag. A normal skin tag does not require any other special care at home.  Take over-the-counter and prescription medicines only as told by your health care provider.  Keep all follow-up visits as told by your health care provider. This is important. Contact a health care provider if:  You have a skin tag that: ? Becomes painful. ? Changes color. ? Bleeds. ? Swells.  You develop more skin tags. This information is not intended to replace advice given to you by your health care provider. Make sure you discuss any questions you have with your health care provider. Document Revised: 11/20/2017 Document Reviewed: 12/23/2015 Elsevier Patient Education  2020 Elsevier Inc.  

## 2020-07-13 LAB — CMP14+EGFR
ALT: 13 IU/L (ref 0–32)
AST: 16 IU/L (ref 0–40)
Albumin/Globulin Ratio: 2 (ref 1.2–2.2)
Albumin: 4.6 g/dL (ref 3.8–4.9)
Alkaline Phosphatase: 83 IU/L (ref 48–121)
BUN/Creatinine Ratio: 25 — ABNORMAL HIGH (ref 9–23)
BUN: 14 mg/dL (ref 6–24)
Bilirubin Total: 0.3 mg/dL (ref 0.0–1.2)
CO2: 25 mmol/L (ref 20–29)
Calcium: 9.8 mg/dL (ref 8.7–10.2)
Chloride: 104 mmol/L (ref 96–106)
Creatinine, Ser: 0.57 mg/dL (ref 0.57–1.00)
GFR calc Af Amer: 117 mL/min/{1.73_m2} (ref >59)
GFR calc non Af Amer: 102 mL/min/{1.73_m2} (ref >59)
Globulin, Total: 2.3 g/dL (ref 1.5–4.5)
Glucose: 89 mg/dL (ref 65–99)
Potassium: 4.2 mmol/L (ref 3.5–5.2)
Sodium: 142 mmol/L (ref 134–144)
Total Protein: 6.9 g/dL (ref 6.0–8.5)

## 2020-07-13 LAB — IRON: Iron: 74 ug/dL (ref 27–159)

## 2020-07-19 ENCOUNTER — Other Ambulatory Visit: Payer: Self-pay | Admitting: "Endocrinology

## 2020-07-26 ENCOUNTER — Ambulatory Visit: Payer: Federal, State, Local not specified - PPO | Admitting: Family

## 2020-08-29 ENCOUNTER — Other Ambulatory Visit: Payer: Self-pay | Admitting: Family

## 2020-08-29 DIAGNOSIS — L719 Rosacea, unspecified: Secondary | ICD-10-CM

## 2020-10-12 ENCOUNTER — Telehealth: Payer: Federal, State, Local not specified - PPO | Admitting: Emergency Medicine

## 2020-10-12 DIAGNOSIS — R300 Dysuria: Secondary | ICD-10-CM

## 2020-10-12 MED ORDER — CEPHALEXIN 500 MG PO CAPS: 500.0000 mg | ORAL_CAPSULE | Freq: Two times a day (BID) | ORAL | 0 refills | Status: AC

## 2020-10-12 NOTE — Progress Notes (Signed)
**  Please do not respond to this message unless you have follow up questions.**  We are sorry that you are not feeling well.  Here is how we plan to help!  Based on what you shared with me it looks like you most likely have a simple urinary tract infection.  A UTI (Urinary Tract Infection) is a bacterial infection of the bladder.  Most cases of urinary tract infections are simple to treat but a key part of your care is to encourage you to drink plenty of fluids and watch your symptoms carefully.  I have prescribed Keflex 500 mg twice a day for 7 days.  Your symptoms should gradually improve. Call us if the burning in your urine worsens, you develop worsening fever, back pain or pelvic pain or if your symptoms do not resolve after completing the antibiotic.  Urinary tract infections can be prevented by drinking plenty of water to keep your body hydrated.  Also be sure when you wipe, wipe from front to back and don't hold it in!  If possible, empty your bladder every 4 hours.  **Please do not respond to this message unless you have follow up questions.** Greater than 5 but less than 10 minutes spent researching, coordinating, and implementing care for this patient today  Your e-visit answers were reviewed by a board certified advanced clinical practitioner to complete your personal care plan.  Depending on the condition, your plan could have included both over the counter or prescription medications.  If there is a problem please reply  once you have received a response from your provider.  Your safety is important to [place].  If you have drug allergies check your prescription carefully.    You can use MyChart to ask questions about today's visit, request a non-urgent call back, or ask for a work or school excuse for 24 hours related to this e-Visit. If it has been greater than 24 hours you will need to follow up with your provider, or enter a new e-Visit to address those concerns.   You will get  an e-mail in the next two days asking about your experience.  I hope that your e-visit has been valuable and will speed your recovery. Thank you for using e-visits.    Greater than 5 but less than 10 minutes spent researching, coordinating, and implementing care for this patient today

## 2020-11-02 DIAGNOSIS — Z23 Encounter for immunization: Secondary | ICD-10-CM | POA: Diagnosis not present

## 2020-11-05 ENCOUNTER — Other Ambulatory Visit: Payer: Self-pay

## 2020-11-05 ENCOUNTER — Ambulatory Visit (INDEPENDENT_AMBULATORY_CARE_PROVIDER_SITE_OTHER): Payer: Federal, State, Local not specified - PPO

## 2020-11-05 DIAGNOSIS — E559 Vitamin D deficiency, unspecified: Secondary | ICD-10-CM | POA: Diagnosis not present

## 2020-11-05 DIAGNOSIS — Z23 Encounter for immunization: Secondary | ICD-10-CM | POA: Diagnosis not present

## 2020-11-05 DIAGNOSIS — E039 Hypothyroidism, unspecified: Secondary | ICD-10-CM | POA: Diagnosis not present

## 2020-11-06 LAB — TSH: TSH: 12.44 mIU/L — ABNORMAL HIGH (ref 0.40–4.50)

## 2020-11-06 LAB — T4, FREE: Free T4: 1.1 ng/dL (ref 0.8–1.8)

## 2020-11-06 LAB — VITAMIN D 25 HYDROXY (VIT D DEFICIENCY, FRACTURES): Vit D, 25-Hydroxy: 36 ng/mL (ref 30–100)

## 2020-11-13 ENCOUNTER — Ambulatory Visit: Payer: Federal, State, Local not specified - PPO | Admitting: "Endocrinology

## 2020-11-21 ENCOUNTER — Ambulatory Visit: Payer: Federal, State, Local not specified - PPO | Admitting: "Endocrinology

## 2020-11-21 ENCOUNTER — Other Ambulatory Visit: Payer: Self-pay

## 2020-11-21 ENCOUNTER — Encounter: Payer: Self-pay | Admitting: "Endocrinology

## 2020-11-21 VITALS — BP 116/80 | HR 60 | Ht 67.0 in | Wt 270.0 lb

## 2020-11-21 DIAGNOSIS — E039 Hypothyroidism, unspecified: Secondary | ICD-10-CM

## 2020-11-21 DIAGNOSIS — E559 Vitamin D deficiency, unspecified: Secondary | ICD-10-CM

## 2020-11-21 MED ORDER — LEVOTHYROXINE SODIUM 25 MCG PO CAPS: 25.0000 ug | ORAL_CAPSULE | Freq: Every day | ORAL | 1 refills | Status: DC

## 2020-11-21 NOTE — Progress Notes (Signed)
11/21/2020, 2:58 PM                Endocrinology follow-up note    Stacy Salazar is a 59 y.o.-year-old female patient being seen in follow-up for  Hypothyroidism.  PCP: Junie Spencer, FNP.   Past Medical History:  Diagnosis Date  . Varicose veins of bilateral lower extremities with other complications     Past Surgical History:  Procedure Laterality Date  . CARPAL TUNNEL RELEASE Left   . CESAREAN SECTION  1993  . CHOLECYSTECTOMY  2016  . ENDOVENOUS ABLATION SAPHENOUS VEIN W/ LASER Left 08/11/2019   endovenous laser ablation left gretaer saphenous vein and stab phlebectomy 10-20 incisions left leg by Cari Caraway MD   . ENDOVENOUS ABLATION SAPHENOUS VEIN W/ LASER Right 08/25/2019   endovenous laser ablation right greater saphenous vein by Cari Caraway MD   . GASTRIC BYPASS  12/23/2011    Social History   Socioeconomic History  . Marital status: Divorced    Spouse name: Not on file  . Number of children: Not on file  . Years of education: Not on file  . Highest education level: Not on file  Occupational History  . Not on file  Tobacco Use  . Smoking status: Never Smoker  . Smokeless tobacco: Never Used  Vaping Use  . Vaping Use: Never used  Substance and Sexual Activity  . Alcohol use: Not Currently    Comment: Glass of wine monthly  . Drug use: Never  . Sexual activity: Not Currently  Other Topics Concern  . Not on file  Social History Narrative  . Not on file   Social Determinants of Health   Financial Resource Strain:   . Difficulty of Paying Living Expenses: Not on file  Food Insecurity:   . Worried About Programme researcher, broadcasting/film/video in the Last Year: Not on file  . Ran Out of Food in the Last Year: Not on file  Transportation Needs:   . Lack of Transportation (Medical): Not on file  . Lack of Transportation (Non-Medical): Not on file  Physical Activity:   . Days of Exercise  per Week: Not on file  . Minutes of Exercise per Session: Not on file  Stress:   . Feeling of Stress : Not on file  Social Connections:   . Frequency of Communication with Friends and Family: Not on file  . Frequency of Social Gatherings with Friends and Family: Not on file  . Attends Religious Services: Not on file  . Active Member of Clubs or Organizations: Not on file  . Attends Banker Meetings: Not on file  . Marital Status: Not on file    Family History  Problem Relation Age of Onset  . Alcohol abuse Mother   . Cancer Mother   . Cirrhosis Mother   . Heart disease Father     Outpatient Encounter Medications as of 11/21/2020  Medication Sig  . CALCIUM CITRATE PO Take 500 mg by mouth 2 (two) times daily.  . ergocalciferol (VITAMIN D2) 1.25 MG (50000  UT) capsule Take 50,000 Units by mouth once a week.  . Levothyroxine Sodium 25 MCG CAPS Take 1 capsule (25 mcg total) by mouth daily before breakfast.  . metroNIDAZOLE (METROGEL) 0.75 % gel APPLY   A THIN LAYER TOPICALLY TWICE DAILY  . Multiple Vitamins-Minerals (MULTIVITAMIN ADULTS PO) Take by mouth.  . pramipexole (MIRAPEX) 0.5 MG tablet Take 2 tablets by mouth at bedtime.  . Sulfacetamide Sodium-Sulfur 10-5 % LOTN Apply topically. (Patient not taking: Reported on 07/12/2020)  . SYNTHROID 200 MCG tablet TAKE 1 TABLET BY MOUTH ONCE DAILY BEFORE BREAKFAST  . [DISCONTINUED] pramipexole (MIRAPEX) 0.25 MG tablet Take 2 tablets by mouth at bedtime.   No facility-administered encounter medications on file as of 11/21/2020.    ALLERGIES: Allergies  Allergen Reactions  . Nsaids     Gastric bypass .   VACCINATION STATUS: Immunization History  Administered Date(s) Administered  . Influenza,inj,Quad PF,6+ Mos 10/14/2012, 10/29/2018, 08/30/2019, 11/05/2020  . PFIZER SARS-COV-2 Vaccination 03/15/2020, 04/07/2020, 11/02/2020  . Tdap 05/02/2019  . Zoster 06/26/2013  . Zoster Recombinat (Shingrix) 01/06/2020, 03/19/2020      HPI    Blondell Laperle  is a patient with the above medical history. she was diagnosed  with hypothyroidism at approximate age of 30 years. She was subsequently initiated on levothyroxine replacement.  she was given various doses of levothyroxine, which was adjusted to 200 mcg daily during her last visit.  She has no new complaints today.  She reports that she is feeling better, has more consistent energy.  She reports compliance with medication, taking in the right way.  Her previsit labs are consistent with slight under replacement.   Pt denies feeling nodules in neck, hoarseness, dysphagia/odynophagia, SOB with lying down.  she denies family history of  thyroid disorders, no family history of thyroid malignancy.  No history of  radiation therapy to head or neck, no history of thyroid surgery.  I reviewed her chart and she also has a history of obesity, status post gastric bypass in 2013.  She also has sleep apnea, restless leg syndrome.   ROS: Limited as above.   Physical Exam: BP 116/80   Pulse 60   Ht 5\' 7"  (1.702 m)   Wt 270 lb (122.5 kg)   BMI 42.29 kg/m  Wt Readings from Last 3 Encounters:  11/21/20 270 lb (122.5 kg)  07/12/20 267 lb 3.2 oz (121.2 kg)  05/09/20 273 lb 3.2 oz (123.9 kg)      CMP ( most recent) CMP     Component Value Date/Time   NA 142 07/12/2020 0947   K 4.2 07/12/2020 0947   CL 104 07/12/2020 0947   CO2 25 07/12/2020 0947   GLUCOSE 89 07/12/2020 0947   BUN 14 07/12/2020 0947   CREATININE 0.57 07/12/2020 0947   CALCIUM 9.8 07/12/2020 0947   PROT 6.9 07/12/2020 0947   ALBUMIN 4.6 07/12/2020 0947   AST 16 07/12/2020 0947   ALT 13 07/12/2020 0947   ALKPHOS 83 07/12/2020 0947   BILITOT 0.3 07/12/2020 0947   GFRNONAA 102 07/12/2020 0947   GFRAA 117 07/12/2020 0947     Lipid Panel     Component Value Date/Time   CHOL 184 10/20/2019 1003   TRIG 148 10/20/2019 1003   HDL 64 10/20/2019 1003   CHOLHDL 2.9 10/20/2019 1003   LDLCALC  95 10/20/2019 1003   LABVLDL 25 10/20/2019 1003       Lab Results  Component Value Date   TSH 12.44 (H) 11/05/2020  TSH 3.97 05/02/2020   TSH 1.50 01/02/2020   TSH 12.300 (H) 10/20/2019   TSH 0.685 05/02/2019   TSH 2.450 10/29/2018   FREET4 1.1 11/05/2020   FREET4 1.3 05/02/2020   FREET4 1.6 01/02/2020       ASSESSMENT: 1. Hypothyroidism 2.  Vitamin D deficiency  PLAN:    Patient with long-standing hypothyroidism.  Her previsit thyroid function tests are consistent with slight under replacement.  I discussed and increased her levothyroxine to 225 mcg p.o. daily before breakfast.     - We discussed about the correct intake of her thyroid hormone, on empty stomach at fasting, with water, separated by at least 30 minutes from breakfast and other medications,  and separated by more than 4 hours from calcium, iron, multivitamins, acid reflux medications (PPIs). -Patient is made aware of the fact that thyroid hormone replacement is needed for life, dose to be adjusted by periodic monitoring of thyroid function tests.  -Due to absence of clinical goiter, no need for thyroid ultrasound.  Given her clinical presentation with  Scalp hair loss, she is offered baseline total testosterone measurement to rule out hyperandrogenism-total testosterone is normal at 18.  She does not have hyperandrogenism.    -Her 25-hydroxy vitamin D is corrected at 64.  She is advised to continue vitamin D2 50,000 units weekly, until she finishes her current supplies.  She is advised to maintain close follow-up with her PCP Jannifer Rodney, FNP.     - Time spent on this patient care encounter:  20 minutes of which 50% was spent in  counseling and the rest reviewing  her current and  previous labs / studies and medications  doses and developing a plan for long term care. Wayna Chalet  participated in the discussions, expressed understanding, and voiced agreement with the above plans.  All questions were  answered to her satisfaction. she is encouraged to contact clinic should she have any questions or concerns prior to her return visit.    Return in about 6 months (around 05/22/2021) for F/U with Pre-visit Labs.  Marquis Lunch, MD [facility] [address] [place], [place] [address] Phone: [phone]  Fax: [phone]   11/21/2020, 2:58 PM  This note was partially dictated with voice recognition software. Similar sounding words can be transcribed inadequately or may not  be corrected upon review.

## 2021-01-02 DIAGNOSIS — E559 Vitamin D deficiency, unspecified: Secondary | ICD-10-CM | POA: Diagnosis not present

## 2021-01-02 DIAGNOSIS — G2581 Restless legs syndrome: Secondary | ICD-10-CM | POA: Diagnosis not present

## 2021-01-02 DIAGNOSIS — D649 Anemia, unspecified: Secondary | ICD-10-CM | POA: Diagnosis not present

## 2021-01-02 DIAGNOSIS — G4733 Obstructive sleep apnea (adult) (pediatric): Secondary | ICD-10-CM | POA: Diagnosis not present

## 2021-01-14 ENCOUNTER — Ambulatory Visit: Payer: Federal, State, Local not specified - PPO | Admitting: Family

## 2021-01-30 ENCOUNTER — Telehealth: Payer: Self-pay | Admitting: "Endocrinology

## 2021-01-30 NOTE — Telephone Encounter (Signed)
Pt is calling and states that we sent blood work orders in and she did those on 11/15, she received a bill from [facility] for $239 and she contacted [employer] and they said they sent [place] a form to fill out because they are saying the codes are wrong and it needs to be resubmitted. im not sure if we received that?  I also gave her billing number. im not sure if we can do anything with this. If you could please reach out to the pt and advise if we can help with these codes or not.

## 2021-01-30 NOTE — Telephone Encounter (Signed)
Left a message with [facility] billing department requesting a return call to the office.

## 2021-01-31 NOTE — Telephone Encounter (Signed)
Talked with [facility] billing representative which stated pt's labs in November had sufficient diagnosis codes but pt had a deductible that the labs were applied to. Discussed and made pt aware. Pt voiced understanding and stated she would contact her [employer].

## 2021-02-02 ENCOUNTER — Telehealth: Payer: Federal, State, Local not specified - PPO | Admitting: Nurse Practitioner

## 2021-02-02 DIAGNOSIS — J069 Acute upper respiratory infection, unspecified: Secondary | ICD-10-CM | POA: Diagnosis not present

## 2021-02-02 MED ORDER — FLUTICASONE PROPIONATE 50 MCG/ACT NA SUSP
2.0000 | Freq: Every day | NASAL | 6 refills | Status: DC
Start: 2021-02-02 — End: 2021-06-03

## 2021-02-02 MED ORDER — BENZONATATE 100 MG PO CAPS: 100.0000 mg | ORAL_CAPSULE | Freq: Three times a day (TID) | ORAL | 0 refills | Status: DC | PRN

## 2021-02-02 NOTE — Progress Notes (Signed)
We are sorry you are not feeling well.  Here is how we plan to help!  Based on what you have shared with me, it looks like you may have a viral upper respiratory infection.  Upper respiratory infections are caused by a large number of viruses; however, rhinovirus is the most common cause.   Symptoms vary from person to person, with common symptoms including sore throat, cough, fatigue or lack of energy and feeling of general discomfort.  A low-grade fever of up to 100.4 may present, but is often uncommon.  Symptoms vary however, and are closely related to a person's age or underlying illnesses.  The most common symptoms associated with an upper respiratory infection are nasal discharge or congestion, cough, sneezing, headache and pressure in the ears and face.  These symptoms usually persist for about 3 to 10 days, but can last up to 2 weeks.  It is important to know that upper respiratory infections do not cause serious illness or complications in most cases.    Upper respiratory infections can be transmitted from person to person, with the most common method of transmission being a person's hands.  The virus is able to live on the skin and can infect other persons for up to 2 hours after direct contact.  Also, these can be transmitted when someone coughs or sneezes; thus, it is important to cover the mouth to reduce this risk.  To keep the spread of the illness at bay, good hand hygiene is very important.  This is an infection that is most likely caused by a virus. There are no specific treatments other than to help you with the symptoms until the infection runs its course.  We are sorry you are not feeling well.  Here is how we plan to help!   For nasal congestion, you may use an oral decongestants such as Mucinex D or if you have glaucoma or high blood pressure use plain Mucinex.  Saline nasal spray or nasal drops can help and can safely be used as often as needed for congestion.  For your congestion,  I have prescribed Fluticasone nasal spray one spray in each nostril twice a day  If you do not have a history of heart disease, hypertension, diabetes or thyroid disease, prostate/bladder issues or glaucoma, you may also use Sudafed to treat nasal congestion.  It is highly recommended that you consult with a pharmacist or your primary care physician to ensure this medication is safe for you to take.     If you have a cough, you may use cough suppressants such as Delsym and Robitussin.  If you have glaucoma or high blood pressure, you can also use Coricidin HBP.   For cough I have prescribed for you A prescription cough medication called Tessalon Perles 100 mg. You may take 1-2 capsules every 8 hours as needed for cough.  * you do not meet criteria for antibiotic at this time. YTou may also wnatt o be covid tested just to be on safe side. .  If you have a sore or scratchy throat, use a saltwater gargle-  to  teaspoon of salt dissolved in a 4-ounce to 8-ounce glass of warm water.  Gargle the solution for approximately 15-30 seconds and then spit.  It is important not to swallow the solution.  You can also use throat lozenges/cough drops and Chloraseptic spray to help with throat pain or discomfort.  Warm or cold liquids can also be helpful in relieving throat pain.  For headache, pain or general discomfort, you can use Ibuprofen or Tylenol as directed.   Some authorities believe that zinc sprays or the use of Echinacea may shorten the course of your symptoms.   HOME CARE . Only take medications as instructed by your medical team. . Be sure to drink plenty of fluids. Water is fine as well as fruit juices, sodas and electrolyte beverages. You may want to stay away from caffeine or alcohol. If you are nauseated, try taking small sips of liquids. How do you know if you are getting enough fluid? Your urine should be a pale yellow or almost colorless. . Get rest. . Taking a steamy shower or using a  humidifier may help nasal congestion and ease sore throat pain. You can place a towel over your head and breathe in the steam from hot water coming from a faucet. . Using a saline nasal spray works much the same way. . Cough drops, hard candies and sore throat lozenges may ease your cough. . Avoid close contacts especially the very young and the elderly . Cover your mouth if you cough or sneeze . Always remember to wash your hands.   GET HELP RIGHT AWAY IF: . You develop worsening fever. . If your symptoms do not improve within 10 days . You develop yellow or green discharge from your nose over 3 days. . You have coughing fits . You develop a severe head ache or visual changes. . You develop shortness of breath, difficulty breathing or start having chest pain . Your symptoms persist after you have completed your treatment plan  MAKE SURE YOU   Understand these instructions.  Will watch your condition.  Will get help right away if you are not doing well or get worse.  Your e-visit answers were reviewed by a board certified advanced clinical practitioner to complete your personal care plan. Depending upon the condition, your plan could have included both over the counter or prescription medications. Please review your pharmacy choice. If there is a problem, you may call our nursing hot line at and have the prescription routed to another pharmacy. Your safety is important to [place]. If you have drug allergies check your prescription carefully.   You can use MyChart to ask questions about today's visit, request a non-urgent call back, or ask for a work or school excuse for 24 hours related to this e-Visit. If it has been greater than 24 hours you will need to follow up with your provider, or enter a new e-Visit to address those concerns. You will get an e-mail in the next two days asking about your experience.  I hope that your e-visit has been valuable and will speed your recovery. Thank you  for using e-visits.   5-10 minutes spent reviewing and documenting in chart.

## 2021-02-11 NOTE — Telephone Encounter (Signed)
Patient is requesting a callback regarding this.  

## 2021-02-11 NOTE — Telephone Encounter (Signed)
Will make pt aware, her insurance is not wanting to cover the Vitamin D level she had drawn in November unless we send them a statement.

## 2021-02-11 NOTE — Telephone Encounter (Signed)
Now that her vitamin D is replete, she will not need 2 tests yearly as long as she take at least 2000 units of vit D3 for maintenance. She can have Vit D check once yearly as part of her physical.

## 2021-02-11 NOTE — Telephone Encounter (Signed)
Pt called stating that her insurance company [employer] is requesting a signed statement from her attending physician of medical necessity for her vitamin D level needing to be evaluated twice a year. [employer] fax [phone]

## 2021-02-11 NOTE — Telephone Encounter (Signed)
Noted  

## 2021-02-11 NOTE — Telephone Encounter (Signed)
That one was necessary to see if her supplements resulted in correction of her previously documented vitamin D deficiency.  I will make a statement for her.

## 2021-02-19 ENCOUNTER — Telehealth: Payer: Self-pay

## 2021-02-19 NOTE — Telephone Encounter (Signed)
Left a message requesting a return call to the office. 

## 2021-02-19 NOTE — Telephone Encounter (Signed)
Patient is asking for a return call from you. Thanks

## 2021-02-20 NOTE — Telephone Encounter (Signed)
Patient called and said that she is sorry she missed your call but will be waiting by the phone for when you call the next time

## 2021-02-20 NOTE — Telephone Encounter (Signed)
Statement printed and will fax to [facility].

## 2021-02-20 NOTE — Telephone Encounter (Signed)
Left a message requesting a return call to the office. 

## 2021-02-20 NOTE — Telephone Encounter (Signed)
You were going to make a statement for pt to send to her insurance company regarding the need for testing of her vitamin d more than once a year.  I haven't been able to locate it.

## 2021-02-20 NOTE — Telephone Encounter (Signed)
  [name],  Can you print and send this statement to whoever is needing it?   ____________________________________________________________________   Stacy Salazar, Stacy Salazar (MRN [id number]) as of 02/20/2021 14:29  DOB : 1961-03-23   To whom it may concern: This patient was found to have vitamin D deficiency which required a prescription for correction. The intervention helped correct her deficit. She was supposed to have labs including 25  Hydroxy Vitamin D measurement to assess. She says that her insurance denies coverage for rechecking of vitamin D, which was a necessary test. Any assistance for providing coverage for this test is appreciated. She will only need 1 vitamin D test yearly for the future.  The following are her 3 vitamin D tests between 09/2019 and  10/2020  10/20/2019 10:03 Vitamin D, 25-Hydroxy: 24.7 (L)  05/02/2020 13:24 Vitamin D, 25-Hydroxy: 23 (L)  11/05/2020 07:06 Vitamin D, 25-Hydroxy: 36  Thank you. Marquis Lunch, MD

## 2021-02-20 NOTE — Telephone Encounter (Signed)
Called pt, she asked if we had faxed over Dr.Nida's statement to [facility] regarding the need to test her vitamin d level more than once a year. I messaged Dr.Nida to help me locate the statement he made so I can refax it to [facility].

## 2021-02-27 ENCOUNTER — Other Ambulatory Visit: Payer: Self-pay | Admitting: "Endocrinology

## 2021-02-27 MED ORDER — SYNTHROID 200 MCG PO TABS: 200.0000 ug | ORAL_TABLET | Freq: Every day | ORAL | 0 refills | Status: DC

## 2021-02-27 NOTE — Addendum Note (Signed)
Addended by: Derrell Lolling on: 02/27/2021 11:04 AM   Modules accepted: Orders

## 2021-02-27 NOTE — Telephone Encounter (Signed)
[employer] would like this re submitted. [name] did this at 4:07pm

## 2021-03-11 ENCOUNTER — Other Ambulatory Visit: Payer: Self-pay | Admitting: Family

## 2021-03-11 DIAGNOSIS — L719 Rosacea, unspecified: Secondary | ICD-10-CM

## 2021-04-22 ENCOUNTER — Other Ambulatory Visit: Payer: Self-pay | Admitting: Family

## 2021-04-22 DIAGNOSIS — Z1231 Encounter for screening mammogram for malignant neoplasm of breast: Secondary | ICD-10-CM

## 2021-05-03 ENCOUNTER — Encounter: Payer: Federal, State, Local not specified - PPO | Admitting: Family

## 2021-05-13 DIAGNOSIS — E039 Hypothyroidism, unspecified: Secondary | ICD-10-CM | POA: Diagnosis not present

## 2021-05-14 LAB — T4, FREE: Free T4: 1.37 ng/dL (ref 0.82–1.77)

## 2021-05-14 LAB — TSH: TSH: 3.94 u[IU]/mL (ref 0.450–4.500)

## 2021-05-15 ENCOUNTER — Other Ambulatory Visit: Payer: Self-pay

## 2021-05-15 ENCOUNTER — Ambulatory Visit
Admission: RE | Admit: 2021-05-15 | Discharge: 2021-05-15 | Disposition: A | Discharge status: Home / Self Care | Payer: Federal, State, Local not specified - PPO | Source: Ambulatory Visit | Attending: Family | Admitting: Family

## 2021-05-15 DIAGNOSIS — Z1231 Encounter for screening mammogram for malignant neoplasm of breast: Secondary | ICD-10-CM

## 2021-05-21 ENCOUNTER — Other Ambulatory Visit: Payer: Self-pay | Admitting: Family

## 2021-05-21 DIAGNOSIS — R928 Other abnormal and inconclusive findings on diagnostic imaging of breast: Secondary | ICD-10-CM

## 2021-05-23 ENCOUNTER — Ambulatory Visit: Payer: Federal, State, Local not specified - PPO | Admitting: "Endocrinology

## 2021-05-30 ENCOUNTER — Other Ambulatory Visit: Payer: Self-pay

## 2021-06-03 ENCOUNTER — Encounter: Payer: Self-pay | Admitting: "Endocrinology

## 2021-06-03 ENCOUNTER — Ambulatory Visit (INDEPENDENT_AMBULATORY_CARE_PROVIDER_SITE_OTHER): Payer: Federal, State, Local not specified - PPO | Admitting: "Endocrinology

## 2021-06-03 VITALS — BP 126/80 | HR 70 | Ht 67.0 in | Wt 292.4 lb

## 2021-06-03 DIAGNOSIS — E039 Hypothyroidism, unspecified: Secondary | ICD-10-CM

## 2021-06-03 DIAGNOSIS — E559 Vitamin D deficiency, unspecified: Secondary | ICD-10-CM

## 2021-06-03 NOTE — Progress Notes (Signed)
06/03/2021, 10:23 AM                Endocrinology follow-up note    Stacy Salazar is a 60 y.o.-year-old female patient being seen in follow-up for  Hypothyroidism.  PCP: Junie Spencer, FNP.   Past Medical History:  Diagnosis Date   Varicose veins of bilateral lower extremities with other complications     Past Surgical History:  Procedure Laterality Date   CARPAL TUNNEL RELEASE Left    CESAREAN SECTION  1993   CHOLECYSTECTOMY  2016   ENDOVENOUS ABLATION SAPHENOUS VEIN W/ LASER Left 08/11/2019   endovenous laser ablation left gretaer saphenous vein and stab phlebectomy 10-20 incisions left leg by Cari Caraway MD    ENDOVENOUS ABLATION SAPHENOUS VEIN W/ LASER Right 08/25/2019   endovenous laser ablation right greater saphenous vein by Cari Caraway MD    GASTRIC BYPASS  12/23/2011    Social History   Socioeconomic History   Marital status: Divorced    Spouse name: Not on file   Number of children: Not on file   Years of education: Not on file   Highest education level: Not on file  Occupational History   Not on file  Tobacco Use   Smoking status: Never   Smokeless tobacco: Never  Vaping Use   Vaping Use: Never used  Substance and Sexual Activity   Alcohol use: Not Currently    Comment: Glass of wine monthly   Drug use: Never   Sexual activity: Not Currently  Other Topics Concern   Not on file  Social History Narrative   Not on file   Social Determinants of Health   Financial Resource Strain: Not on file  Food Insecurity: Not on file  Transportation Needs: Not on file  Physical Activity: Not on file  Stress: Not on file  Social Connections: Not on file    Family History  Problem Relation Age of Onset   Alcohol abuse Mother    Cancer Mother    Cirrhosis Mother    Heart disease Father    Breast cancer Neg Hx     Outpatient Encounter Medications as of 06/03/2021   Medication Sig   pramipexole (MIRAPEX) 0.5 MG tablet Take 3 tablets by mouth at bedtime.   CALCIUM CITRATE PO Take 500 mg by mouth 2 (two) times daily.   Levothyroxine Sodium 25 MCG CAPS Take 1 capsule (25 mcg total) by mouth daily before breakfast.   metroNIDAZOLE (METROGEL) 0.75 % gel APPLY A THIN LAYER TOPICALLY TWICE DAILY   Multiple Vitamins-Minerals (MULTIVITAMIN ADULTS PO) Take by mouth.   Sulfacetamide Sodium-Sulfur 10-5 % LOTN Apply topically. (Patient not taking: Reported on 07/12/2020)   SYNTHROID 200 MCG tablet Take 1 tablet (200 mcg total) by mouth daily before breakfast.   [DISCONTINUED] benzonatate (TESSALON PERLES) 100 MG capsule Take 1 capsule (100 mg total) by mouth 3 (three) times daily as needed.   [DISCONTINUED] ergocalciferol (VITAMIN D2) 1.25 MG (50000 UT) capsule Take 50,000 Units by mouth once a week.   [DISCONTINUED] fluticasone (FLONASE) 50 MCG/ACT  nasal spray Place 2 sprays into both nostrils daily.   No facility-administered encounter medications on file as of 06/03/2021.    ALLERGIES: Allergies  Allergen Reactions   Nsaids     Gastric bypass .   VACCINATION STATUS: Immunization History  Administered Date(s) Administered   Influenza,inj,Quad PF,6+ Mos 10/14/2012, 10/29/2018, 08/30/2019, 11/05/2020   PFIZER(Purple Top)SARS-COV-2 Vaccination 03/15/2020, 04/07/2020, 11/02/2020   Tdap 05/02/2019   Zoster Recombinat (Shingrix) 01/06/2020, 03/19/2020   Zoster, Live 06/26/2013     HPI    Stacy Salazar  is a patient with the above medical history. she was diagnosed  with hypothyroidism at approximate age of 30 years. She was subsequently initiated on levothyroxine replacement.  she was given various doses of levothyroxine over the years.  During her last visit her dose was adjusted to levothyroxine 225 mcg p.o. daily before breakfast.   She continued to feel better.  She has no new complaints today.  She has more consistent energy.  She reports compliance to  her medication.  Her previsit thyroid function tests are consistent with appropriate replacement.      Pt denies feeling nodules in neck, hoarseness, dysphagia/odynophagia, SOB with lying down.  she denies family history of  thyroid disorders, no family history of thyroid malignancy.  No history of  radiation therapy to head or neck, no history of thyroid surgery.  I reviewed her chart and she also has a history of obesity, status post gastric bypass in 2013.  She also has sleep apnea, restless leg syndrome.   ROS: Limited as above.   Physical Exam: BP 126/80   Pulse 70   Ht 5\' 7"  (1.702 m)   Wt 292 lb 6.4 oz (132.6 kg)   BMI 45.80 kg/m  Wt Readings from Last 3 Encounters:  06/03/21 292 lb 6.4 oz (132.6 kg)  11/21/20 270 lb (122.5 kg)  07/12/20 267 lb 3.2 oz (121.2 kg)      CMP ( most recent) CMP     Component Value Date/Time   NA 142 07/12/2020 0947   K 4.2 07/12/2020 0947   CL 104 07/12/2020 0947   CO2 25 07/12/2020 0947   GLUCOSE 89 07/12/2020 0947   BUN 14 07/12/2020 0947   CREATININE 0.57 07/12/2020 0947   CALCIUM 9.8 07/12/2020 0947   PROT 6.9 07/12/2020 0947   ALBUMIN 4.6 07/12/2020 0947   AST 16 07/12/2020 0947   ALT 13 07/12/2020 0947   ALKPHOS 83 07/12/2020 0947   BILITOT 0.3 07/12/2020 0947   GFRNONAA 102 07/12/2020 0947   GFRAA 117 07/12/2020 0947     Lipid Panel     Component Value Date/Time   CHOL 184 10/20/2019 1003   TRIG 148 10/20/2019 1003   HDL 64 10/20/2019 1003   CHOLHDL 2.9 10/20/2019 1003   LDLCALC 95 10/20/2019 1003   LABVLDL 25 10/20/2019 1003       Lab Results  Component Value Date   TSH 3.940 05/13/2021   TSH 12.44 (H) 11/05/2020   TSH 3.97 05/02/2020   TSH 1.50 01/02/2020   TSH 12.300 (H) 10/20/2019   TSH 0.685 05/02/2019   TSH 2.450 10/29/2018   FREET4 1.37 05/13/2021   FREET4 1.1 11/05/2020   FREET4 1.3 05/02/2020   FREET4 1.6 01/02/2020       ASSESSMENT: 1. Hypothyroidism 2.  Vitamin D  deficiency  PLAN:    Patient with long-standing hypothyroidism.  Her previsit thyroid function tests are consistent with appropriate replacement.  She is advised to continue  levothyroxine 225 mcg p.o. daily before breakfast.     - We discussed about the correct intake of her thyroid hormone, on empty stomach at fasting, with water, separated by at least 30 minutes from breakfast and other medications,  and separated by more than 4 hours from calcium, iron, multivitamins, acid reflux medications (PPIs). -Patient is made aware of the fact that thyroid hormone replacement is needed for life, dose to be adjusted by periodic monitoring of thyroid function tests.   -Due to absence of clinical goiter, no need for thyroid ultrasound.  Given her clinical presentation with  Scalp hair loss, she is offered baseline total testosterone measurement to rule out hyperandrogenism-total testosterone is normal at 18.  She does not have hyperandrogenism.    -Her 25-hydroxy vitamin D is corrected at 10.  She is advised to finish her current supplies of vitamin D2 50,000 units weekly.  She will have repeat 25-hydroxy vitamin D measurement before her next visit.  She may need maintenance low-dose vitamin D during the next winter.     She is advised to maintain close follow-up with her PCP Jannifer Rodney, FNP.    I spent 25 minutes in the care of the patient today including review of labs from Thyroid Function, CMP, and other relevant labs ; imaging/biopsy records (current and previous including abstractions from other facilities); face-to-face time discussing  her lab results and symptoms, medications doses, her options of short and long term treatment based on the latest standards of care / guidelines;   and documenting the encounter.  Wayna Chalet  participated in the discussions, expressed understanding, and voiced agreement with the above plans.  All questions were answered to her satisfaction. she is encouraged  to contact clinic should she have any questions or concerns prior to her return visit.     Return in about 6 months (around 12/03/2021) for F/U with Pre-visit Labs.  Marquis Lunch, MD [facility] [address] [place], [place] [address] Phone: [phone]  Fax: [phone]   06/03/2021, 10:23 AM  This note was partially dictated with voice recognition software. Similar sounding words can be transcribed inadequately or may not  be corrected upon review.

## 2021-06-05 ENCOUNTER — Other Ambulatory Visit: Payer: Federal, State, Local not specified - PPO

## 2021-06-14 ENCOUNTER — Ambulatory Visit: Payer: Federal, State, Local not specified - PPO

## 2021-06-14 ENCOUNTER — Telehealth: Payer: Self-pay | Admitting: Family

## 2021-06-14 NOTE — Telephone Encounter (Signed)
Pt called stating that she has a rash and she believes its because of a change in her medicine for her restless leg.   Pt scheduled an appt to see [facility] on Monday since [facility] didn't have any openings but wants to know what she can do/take in the mean time to help with rash.  Please call pt or email patient through my chart regarding this issue.   [phone]

## 2021-06-17 ENCOUNTER — Encounter: Payer: Self-pay | Admitting: Family Medicine

## 2021-06-17 ENCOUNTER — Ambulatory Visit: Payer: Federal, State, Local not specified - PPO | Admitting: Family Medicine

## 2021-06-17 ENCOUNTER — Other Ambulatory Visit: Payer: Self-pay

## 2021-06-17 VITALS — BP 144/87 | HR 61 | Ht 67.0 in | Wt 298.0 lb

## 2021-06-17 DIAGNOSIS — A6920 Lyme disease, unspecified: Secondary | ICD-10-CM

## 2021-06-17 DIAGNOSIS — L739 Follicular disorder, unspecified: Secondary | ICD-10-CM | POA: Diagnosis not present

## 2021-06-17 MED ORDER — DOXYCYCLINE HYCLATE 100 MG PO TABS: 100.0000 mg | ORAL_TABLET | Freq: Two times a day (BID) | ORAL | 0 refills | Status: DC

## 2021-06-17 NOTE — Progress Notes (Signed)
BP (!) 144/87   Pulse 61   Ht 5\' 7"  (1.702 m)   Wt 298 lb (135.2 kg)   SpO2 98%   BMI 46.67 kg/m    Subjective:   Patient ID: Stacy Salazar, female    DOB: Jun 18, 1961, 60 y.o.   MRN: [id number]  HPI: Stacy Salazar is a 60 y.o. female presenting on 06/17/2021 for Rash (BUE, back, bilateral thighs. Recent beach trip. ? Bit by something vs sun poisoning. Places are raised and itchy. Had blisters on some areas)   HPI Patient comes in complaining of a rash that she started having, possibly after the beach or possibly after being in a warm salt water pool.  She also feels like she may have been bit by something because she started developing this large red rash that is a large circle on her upper back with a spot in the middle that looks like she was bit.  She cannot recall being bit by something.  She said the rash over her body started 6 days ago and is on her upper torso upper arms and upper thighs.  She said the large circle and target rash started about 4 days ago.  Relevant past medical, surgical, family and social history reviewed and updated as indicated. Interim medical history since our last visit reviewed. Allergies and medications reviewed and updated.  Review of Systems  Constitutional:  Negative for chills and fever.  Eyes:  Negative for redness and visual disturbance.  Respiratory:  Negative for chest tightness and shortness of breath.   Cardiovascular:  Negative for chest pain and leg swelling.  Genitourinary:  Negative for difficulty urinating and dysuria.  Musculoskeletal:  Negative for back pain and gait problem.  Skin:  Positive for color change and rash. Negative for wound.  Neurological:  Negative for light-headedness and headaches.  Psychiatric/Behavioral:  Negative for agitation and behavioral problems.   All other systems reviewed and are negative.  Per HPI unless specifically indicated above   Allergies as of 06/17/2021       Reactions   Nsaids    Gastric  bypass .        Medication List        Accurate as of June 17, 2021  8:32 AM. If you have any questions, ask your nurse or doctor.          STOP taking these medications    Sulfacetamide Sodium-Sulfur 10-5 % Lotn Stopped by: June 19, 2021 Andraya Frigon, MD       TAKE these medications    CALCIUM CITRATE PO Take 500 mg by mouth 2 (two) times daily.   doxycycline 100 MG tablet Commonly known as: VIBRA-TABS Take 1 tablet (100 mg total) by mouth 2 (two) times daily. 1 po bid Started by: Elige Radon Paden Senger, MD   Levothyroxine Sodium 25 MCG Caps Take 1 capsule (25 mcg total) by mouth daily before breakfast.   Synthroid 200 MCG tablet Generic drug: levothyroxine Take 1 tablet (200 mcg total) by mouth daily before breakfast.   metroNIDAZOLE 0.75 % gel Commonly known as: METROGEL APPLY A THIN LAYER TOPICALLY TWICE DAILY   MULTIVITAMIN ADULTS PO Take by mouth.   pramipexole 0.5 MG tablet Commonly known as: MIRAPEX Take 3 tablets by mouth at bedtime.         Objective:   BP (!) 144/87   Pulse 61   Ht 5\' 7"  (1.702 m)   Wt 298 lb (135.2 kg)   SpO2 98%   BMI 46.67  kg/m   Wt Readings from Last 3 Encounters:  06/17/21 298 lb (135.2 kg)  06/03/21 292 lb 6.4 oz (132.6 kg)  11/21/20 270 lb (122.5 kg)    Physical Exam Vitals and nursing note reviewed.  Constitutional:      General: She is not in acute distress.    Appearance: She is well-developed. She is not diaphoretic.  Eyes:     Conjunctiva/sclera: Conjunctivae normal.  Skin:    General: Skin is warm and dry.     Findings: Rash (papular rash on upper body and upper legs and abdomen) present.       Neurological:     Mental Status: She is alert and oriented to person, place, and time.     Coordination: Coordination normal.  Psychiatric:        Behavior: Behavior normal.      Assessment & Plan:   Problem List Items Addressed This Visit   None Visit Diagnoses     Folliculitis    -  Primary    Relevant Medications   doxycycline (VIBRA-TABS) 100 MG tablet   Erythema migrans (Lyme disease)       Relevant Medications   doxycycline (VIBRA-TABS) 100 MG tablet       Target rash, will give doxycycline, also looks like folliculitis possibly from the warm water that she was in.  We will treat both with doxycycline, call if not improved or if worsens Follow up plan: Return if symptoms worsen or fail to improve.  Counseling provided for all of the vaccine components No orders of the defined types were placed in this encounter.   Arville Care, MD [facility] 06/17/2021, 8:32 AM

## 2021-06-18 NOTE — Telephone Encounter (Signed)
Pt has appointment with me today

## 2021-06-20 ENCOUNTER — Encounter: Payer: Self-pay | Admitting: Family Medicine

## 2021-06-26 ENCOUNTER — Other Ambulatory Visit: Payer: Self-pay

## 2021-06-26 ENCOUNTER — Ambulatory Visit: Admission: RE | Admit: 2021-06-26 | Payer: Federal, State, Local not specified - PPO | Source: Ambulatory Visit

## 2021-06-26 ENCOUNTER — Ambulatory Visit
Admission: RE | Admit: 2021-06-26 | Discharge: 2021-06-26 | Disposition: A | Discharge status: Home / Self Care | Payer: Federal, State, Local not specified - PPO | Source: Ambulatory Visit | Attending: Family | Admitting: Family

## 2021-06-26 DIAGNOSIS — R928 Other abnormal and inconclusive findings on diagnostic imaging of breast: Secondary | ICD-10-CM | POA: Diagnosis not present

## 2021-06-26 DIAGNOSIS — R922 Inconclusive mammogram: Secondary | ICD-10-CM | POA: Diagnosis not present

## 2021-06-27 ENCOUNTER — Encounter: Payer: Self-pay | Admitting: Family

## 2021-06-27 ENCOUNTER — Ambulatory Visit: Payer: Federal, State, Local not specified - PPO | Admitting: Family

## 2021-06-27 VITALS — BP 129/76 | HR 78 | Temp 98.0°F | Ht 67.0 in | Wt 289.8 lb

## 2021-06-27 DIAGNOSIS — R21 Rash and other nonspecific skin eruption: Secondary | ICD-10-CM | POA: Diagnosis not present

## 2021-06-27 DIAGNOSIS — I498 Other specified cardiac arrhythmias: Secondary | ICD-10-CM

## 2021-06-27 DIAGNOSIS — S20469D Insect bite (nonvenomous) of unspecified back wall of thorax, subsequent encounter: Secondary | ICD-10-CM | POA: Diagnosis not present

## 2021-06-27 DIAGNOSIS — Z713 Dietary counseling and surveillance: Secondary | ICD-10-CM

## 2021-06-27 DIAGNOSIS — R42 Dizziness and giddiness: Secondary | ICD-10-CM | POA: Diagnosis not present

## 2021-06-27 DIAGNOSIS — A6920 Lyme disease, unspecified: Secondary | ICD-10-CM

## 2021-06-27 DIAGNOSIS — Z9884 Bariatric surgery status: Secondary | ICD-10-CM

## 2021-06-27 DIAGNOSIS — L739 Follicular disorder, unspecified: Secondary | ICD-10-CM

## 2021-06-27 DIAGNOSIS — W57XXXD Bitten or stung by nonvenomous insect and other nonvenomous arthropods, subsequent encounter: Secondary | ICD-10-CM

## 2021-06-27 DIAGNOSIS — E039 Hypothyroidism, unspecified: Secondary | ICD-10-CM

## 2021-06-27 MED ORDER — DOXYCYCLINE HYCLATE 100 MG PO TABS
100.0000 mg | ORAL_TABLET | Freq: Two times a day (BID) | ORAL | 0 refills | Status: DC
Start: 2021-06-27 — End: 2021-10-09

## 2021-06-27 MED ORDER — SAXENDA 18 MG/3ML ~~LOC~~ SOPN: PEN_INJECTOR | SUBCUTANEOUS | 0 refills | Status: AC

## 2021-06-27 NOTE — Progress Notes (Signed)
Kg   

## 2021-06-27 NOTE — Progress Notes (Signed)
Subjective:    Patient ID: Stacy Salazar, female    DOB: 1961/10/27, 60 y.o.   MRN: [id number]  Chief Complaint  Patient presents with   Dizziness   heart flutter   PT presents to the office today for complaints of heart flutter and dizziness on and off over the last 3 months. She reports she had COVID in March. She not recall any triggers and states they have occurred at all different times of the day.   She reports she had a bite on her back in March. She was seen on 82/42/35 for folliculitis and given doxycycline for 14 days and is on currently on 11. Reports her rash and bite on her back have greatly improved. She would like to be tested for Lyme's today.    She is requesting medications to help her lose weight. She reports she has gained 30 lbs over the last 6 months. She has hx of gastric bypass.  Dizziness This is a new problem. The current episode started more than 1 month ago. Pertinent negatives include no diaphoresis or fatigue.  Thyroid Problem Presents for follow-up visit. Patient reports no diaphoresis, fatigue or leg swelling. The symptoms have been stable.  Anemia Presents for follow-up visit. Symptoms include leg swelling. There has been no malaise/fatigue.     Review of Systems  Constitutional:  Negative for diaphoresis, fatigue and malaise/fatigue.  Neurological:  Positive for dizziness.  All other systems reviewed and are negative.     Objective:   Physical Exam Vitals reviewed.  Constitutional:      General: She is not in acute distress.    Appearance: She is well-developed.  HENT:     Head: Normocephalic and atraumatic.     Right Ear: Tympanic membrane normal.     Left Ear: Tympanic membrane normal.  Eyes:     Pupils: Pupils are equal, round, and reactive to light.  Neck:     Thyroid: No thyromegaly.  Cardiovascular:     Rate and Rhythm: Normal rate and regular rhythm.     Heart sounds: Normal heart sounds. No murmur heard. Pulmonary:     Effort:  Pulmonary effort is normal. No respiratory distress.     Breath sounds: Normal breath sounds. No wheezing.  Abdominal:     General: Bowel sounds are normal. There is no distension.     Palpations: Abdomen is soft.     Tenderness: There is no abdominal tenderness.  Musculoskeletal:        General: No tenderness. Normal range of motion.     Cervical back: Normal range of motion and neck supple.  Skin:    General: Skin is warm and dry.     Findings: Rash present.       Neurological:     Mental Status: She is alert and oriented to person, place, and time.     Cranial Nerves: No cranial nerve deficit.     Deep Tendon Reflexes: Reflexes are normal and symmetric.  Psychiatric:        Behavior: Behavior normal.        Thought Content: Thought content normal.        Judgment: Judgment normal.      BP 129/76   Pulse 78   Temp 98 F (36.7 C) (Temporal)   Ht _0  (1.702 m)   Wt 289 lb 12.8 oz (131.5 kg)   SpO2 95%   BMI 45.39 kg/m      Assessment & Plan:  Annette  Shorb comes in today with chief complaint of Dizziness and heart flutter   Diagnosis and orders addressed:  1. Fluttering heart -EKG normal today. Could be related to COVID? Stress management, avoid caffeine.  - EKG 12-Lead - Anemia Profile B - CMP14+EGFR  2. Dizziness - Anemia Profile B - CMP14+EGFR  3. Rash and nonspecific skin eruption - CMP14+EGFR - Rocky mtn spotted fvr abs pnl(IgG+IgM) - Lyme Disease Serology w/Reflex  4. Tick bite of back wall of thorax, unspecified location, subsequent encounter - CMP14+EGFR - Rocky mtn spotted fvr abs pnl(IgG+IgM) - Lyme Disease Serology w/Reflex  5. Hypothyroidism, unspecified type - CMP14+EGFR - TSH  6. Morbid obesity (HCC) - CMP14+EGFR - Liraglutide -Weight Management (SAXENDA) 18 MG/3ML SOPN; Inject 0.6 mg into the skin daily for 7 days, THEN 1.2 mg daily for 14 days.  Dispense: 3.5 mL; Refill: 0 - Amb Ref to Medical Weight Management  7. Hx of  gastric bypass - CMP14+EGFR - Liraglutide -Weight Management (SAXENDA) 18 MG/3ML SOPN; Inject 0.6 mg into the skin daily for 7 days, THEN 1.2 mg daily for 14 days.  Dispense: 3.5 mL; Refill: 0 - Amb Ref to Medical Weight Management  8. Weight loss counseling, encounter for -Start Saxenda today - CMP14+EGFR - Liraglutide -Weight Management (SAXENDA) 18 MG/3ML SOPN; Inject 0.6 mg into the skin daily for 7 days, THEN 1.2 mg daily for 14 days.  Dispense: 3.5 mL; Refill: 0 - Amb Ref to Medical Weight Management  9. Folliculitis - doxycycline (VIBRA-TABS) 100 MG tablet; Take 1 tablet (100 mg total) by mouth 2 (two) times daily. 1 po bid  Dispense: 28 tablet; Refill: 0  10. Erythema migrans (Lyme disease) - doxycycline (VIBRA-TABS) 100 MG tablet; Take 1 tablet (100 mg total) by mouth 2 (two) times daily. 1 po bid  Dispense: 28 tablet; Refill: 0   Labs pending Health Maintenance reviewed Diet and exercise encouraged  Follow up plan: 3 months    [name], FNP

## 2021-06-27 NOTE — Patient Instructions (Signed)
Exercising to Lose Weight Exercise is structured, repetitive physical activity to improve fitness and health. Getting regular exercise is important for everyone. It is especially important if you are overweight. Being overweight increases your risk of heart disease, stroke, diabetes, high blood pressure, and several types of cancer.Reducing your calorie intake and exercising can help you lose weight. Exercise is usually categorized as moderate or vigorous intensity. To lose weight, most people need to do a certain amount of moderate-intensity orvigorous-intensity exercise each week. Moderate-intensity exercise  Moderate-intensity exercise is any activity that gets you moving enough to burn at least three times more energy (calories) than if you were sitting. Examples of moderate exercise include: Walking a mile in 15 minutes. Doing light yard work. Biking at an easy pace. Most people should get at least 150 minutes (2 hours and 30 minutes) a week ofmoderate-intensity exercise to maintain their body weight. Vigorous-intensity exercise Vigorous-intensity exercise is any activity that gets you moving enough to burn at least six times more calories than if you were sitting. When you exercise at this intensity, you should be working hard enough that you are not able tocarry on a conversation. Examples of vigorous exercise include: Running. Playing a team sport, such as football, basketball, and soccer. Jumping rope. Most people should get at least 75 minutes (1 hour and 15 minutes) a week ofvigorous-intensity exercise to maintain their body weight. How can exercise affect me? When you exercise enough to burn more calories than you eat, you lose weight. Exercise also reduces body fat and builds muscle. The more muscle you have, the more calories you burn. Exercise also: Improves mood. Reduces stress and tension. Improves your overall fitness, flexibility, and endurance. Increases bone strength. The  amount of exercise you need to lose weight depends on: Your age. The type of exercise. Any health conditions you have. Your overall physical ability. Talk to your health care provider about how much exercise you need and whattypes of activities are safe for you. What actions can I take to lose weight? Nutrition  Make changes to your diet as told by your health care provider or diet and nutrition specialist (dietitian). This may include: Eating fewer calories. Eating more protein. Eating less unhealthy fats. Eating a diet that includes fresh fruits and vegetables, whole grains, low-fat dairy products, and lean protein. Avoiding foods with added fat, salt, and sugar. Drink plenty of water while you exercise to prevent dehydration or heat stroke.  Activity Choose an activity that you enjoy and set realistic goals. Your health care provider can help you make an exercise plan that works for you. Exercise at a moderate or vigorous intensity most days of the week. The intensity of exercise may vary from person to person. You can tell how intense a workout is for you by paying attention to your breathing and heartbeat. Most people will notice their breathing and heartbeat get faster with more intense exercise. Do resistance training twice each week, such as: Push-ups. Sit-ups. Lifting weights. Using resistance bands. Getting short amounts of exercise can be just as helpful as long structured periods of exercise. If you have trouble finding time to exercise, try to include exercise in your daily routine. Get up, stretch, and walk around every 30 minutes throughout the day. Go for a walk during your lunch break. Park your car farther away from your destination. If you take public transportation, get off one stop early and walk the rest of the way. Make phone calls while standing up and   walking around. Take the stairs instead of elevators or escalators. Wear comfortable clothes and shoes with  good support. Do not exercise so much that you hurt yourself, feel dizzy, or get very short of breath. Where to find more information [place] Department of Health and Human Services: www.hhs.gov Centers for Disease Control and Prevention (CDC): www.cdc.gov Contact a health care provider: Before starting a new exercise program. If you have questions or concerns about your weight. If you have a medical problem that keeps you from exercising. Get help right away if you have any of the following while exercising: Injury. Dizziness. Difficulty breathing or shortness of breath that does not go away when you stop exercising. Chest pain. Rapid heartbeat. Summary Being overweight increases your risk of heart disease, stroke, diabetes, high blood pressure, and several types of cancer. Losing weight happens when you burn more calories than you eat. Reducing the amount of calories you eat in addition to getting regular moderate or vigorous exercise each week helps you lose weight. This information is not intended to replace advice given to you by your health care provider. Make sure you discuss any questions you have with your healthcare provider. Document Revised: 03/19/2020 Document Reviewed: 04/05/2020 Elsevier Patient Education  2022 [employer]  

## 2021-06-28 ENCOUNTER — Telehealth: Payer: Self-pay | Admitting: Family

## 2021-06-28 DIAGNOSIS — Z9884 Bariatric surgery status: Secondary | ICD-10-CM

## 2021-06-28 NOTE — Telephone Encounter (Signed)
Referral Weight loss Clinic

## 2021-06-28 NOTE — Telephone Encounter (Signed)
Pt called stating that she went to pick up her Rx's at the pharmacy and was told that the Saxenda was not covered by her insurance and would cost her over $1000 to fill. Pt cant afford.  Pt needs advise. Please call patient.

## 2021-06-30 LAB — CMP14+EGFR
ALT: 16 IU/L (ref 0–32)
AST: 17 IU/L (ref 0–40)
Albumin/Globulin Ratio: 2 (ref 1.2–2.2)
Albumin: 4.6 g/dL (ref 3.8–4.9)
Alkaline Phosphatase: 91 IU/L (ref 44–121)
BUN/Creatinine Ratio: 23 (ref 12–28)
BUN: 15 mg/dL (ref 8–27)
Bilirubin Total: 0.3 mg/dL (ref 0.0–1.2)
CO2: 24 mmol/L (ref 20–29)
Calcium: 9.6 mg/dL (ref 8.7–10.3)
Chloride: 102 mmol/L (ref 96–106)
Creatinine, Ser: 0.66 mg/dL (ref 0.57–1.00)
Globulin, Total: 2.3 g/dL (ref 1.5–4.5)
Glucose: 89 mg/dL (ref 65–99)
Potassium: 5 mmol/L (ref 3.5–5.2)
Sodium: 140 mmol/L (ref 134–144)
Total Protein: 6.9 g/dL (ref 6.0–8.5)
eGFR: 100 mL/min/{1.73_m2} (ref >59)

## 2021-06-30 LAB — ANEMIA PROFILE B
Basophils Absolute: 0.1 10*3/uL (ref 0.0–0.2)
Basos: 1 %
EOS (ABSOLUTE): 0.2 10*3/uL (ref 0.0–0.4)
Eos: 3 %
Ferritin: 93 ng/mL (ref 15–150)
Folate: >20 ng/mL (ref >3.0)
Hematocrit: 40 % (ref 34.0–46.6)
Hemoglobin: 13.3 g/dL (ref 11.1–15.9)
Immature Grans (Abs): 0 10*3/uL (ref 0.0–0.1)
Immature Granulocytes: 0 %
Iron Saturation: 24 % (ref 15–55)
Iron: 87 ug/dL (ref 27–159)
Lymphocytes Absolute: 2.3 10*3/uL (ref 0.7–3.1)
Lymphs: 36 %
MCH: 30.8 pg (ref 26.6–33.0)
MCHC: 33.3 g/dL (ref 31.5–35.7)
MCV: 93 fL (ref 79–97)
Monocytes Absolute: 0.5 10*3/uL (ref 0.1–0.9)
Monocytes: 8 %
Neutrophils Absolute: 3.3 10*3/uL (ref 1.4–7.0)
Neutrophils: 52 %
Platelets: 273 10*3/uL (ref 150–450)
RBC: 4.32 x10E6/uL (ref 3.77–5.28)
RDW: 12.2 % (ref 11.7–15.4)
Retic Ct Pct: 1.8 % (ref 0.6–2.6)
Total Iron Binding Capacity: 362 ug/dL (ref 250–450)
UIBC: 275 ug/dL (ref 131–425)
Vitamin B-12: 1030 pg/mL (ref 232–1245)
WBC: 6.3 10*3/uL (ref 3.4–10.8)

## 2021-06-30 LAB — ROCKY MTN SPOTTED FVR ABS PNL(IGG+IGM)
RMSF IgG: NEGATIVE
RMSF IgM: 0.47 index (ref 0.00–0.89)

## 2021-06-30 LAB — TSH: TSH: 4.21 u[IU]/mL (ref 0.450–4.500)

## 2021-06-30 LAB — LYME DISEASE SEROLOGY W/REFLEX: Lyme Total Antibody EIA: NEGATIVE

## 2021-07-01 DIAGNOSIS — G4733 Obstructive sleep apnea (adult) (pediatric): Secondary | ICD-10-CM | POA: Diagnosis not present

## 2021-07-01 DIAGNOSIS — D649 Anemia, unspecified: Secondary | ICD-10-CM | POA: Diagnosis not present

## 2021-07-01 DIAGNOSIS — E559 Vitamin D deficiency, unspecified: Secondary | ICD-10-CM | POA: Diagnosis not present

## 2021-07-01 DIAGNOSIS — G2581 Restless legs syndrome: Secondary | ICD-10-CM | POA: Diagnosis not present

## 2021-07-02 ENCOUNTER — Other Ambulatory Visit: Payer: Self-pay | Admitting: "Endocrinology

## 2021-07-02 ENCOUNTER — Other Ambulatory Visit: Payer: Self-pay | Admitting: Family

## 2021-07-02 DIAGNOSIS — L719 Rosacea, unspecified: Secondary | ICD-10-CM

## 2021-07-05 ENCOUNTER — Telehealth: Payer: Self-pay | Admitting: Family

## 2021-07-05 NOTE — Telephone Encounter (Signed)
Placed referral with [facility] called and notified that they will reach out and schedule her.

## 2021-07-05 NOTE — Telephone Encounter (Signed)
REFERRAL REQUEST Telephone Note  Have you been seen at our office for this problem? yes (Advise that they may need an appointment with their PCP before a referral can be done)  Reason for Referral: Weight Management Referral discussed with patient: yes Best contact number of patient for referral team:   #[phone] Has patient been seen by a specialist for this issue before: no Patient provider preference for referral: ? Patient location preference for referral: [facility] Area   Patient notified that referrals can take up to a week or longer to process. If they haven't heard anything within a week they should call back and speak with the referral department.    Lendon Colonel' pt.  Please call pt bc she has been waiting on a call for awhile now.

## 2021-07-18 IMAGING — MG MM DIGITAL SCREENING BILAT W/ TOMO AND CAD
8 series · 8 of 24 positions shown · non-contrast
Comparison: Previous exam(s).

CLINICAL DATA: Screening.

EXAM:
DIGITAL SCREENING BILATERAL MAMMOGRAM WITH TOMOSYNTHESIS AND CAD
TECHNIQUE: Bilateral screening digital craniocaudal and mediolateral oblique
mammograms were obtained. Bilateral screening digital breast
tomosynthesis was performed. The images were evaluated with
computer-aided detection.

[L CC synth-2D]
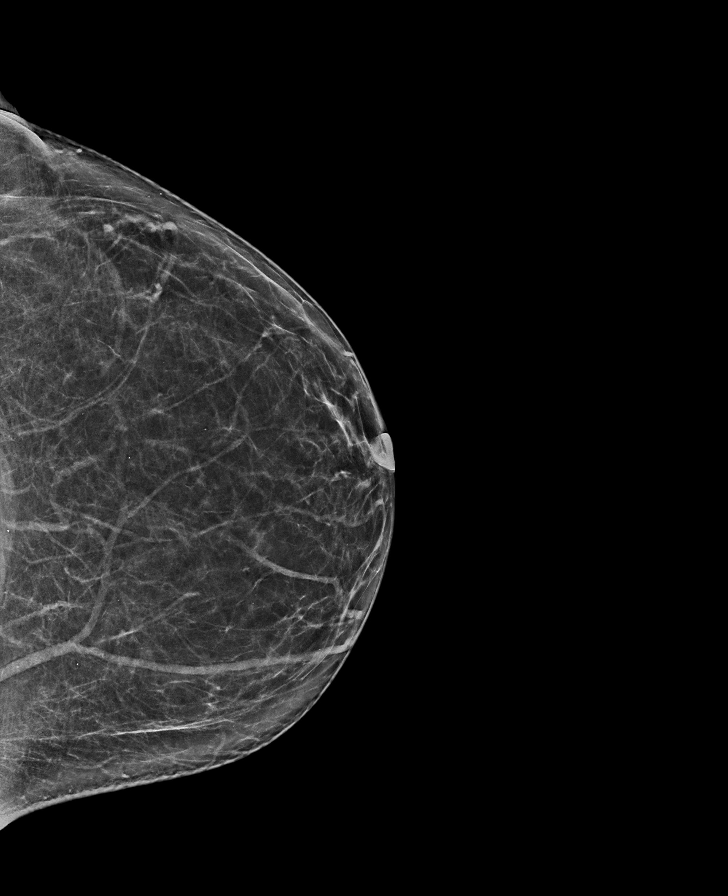

[L MLO synth-2D]
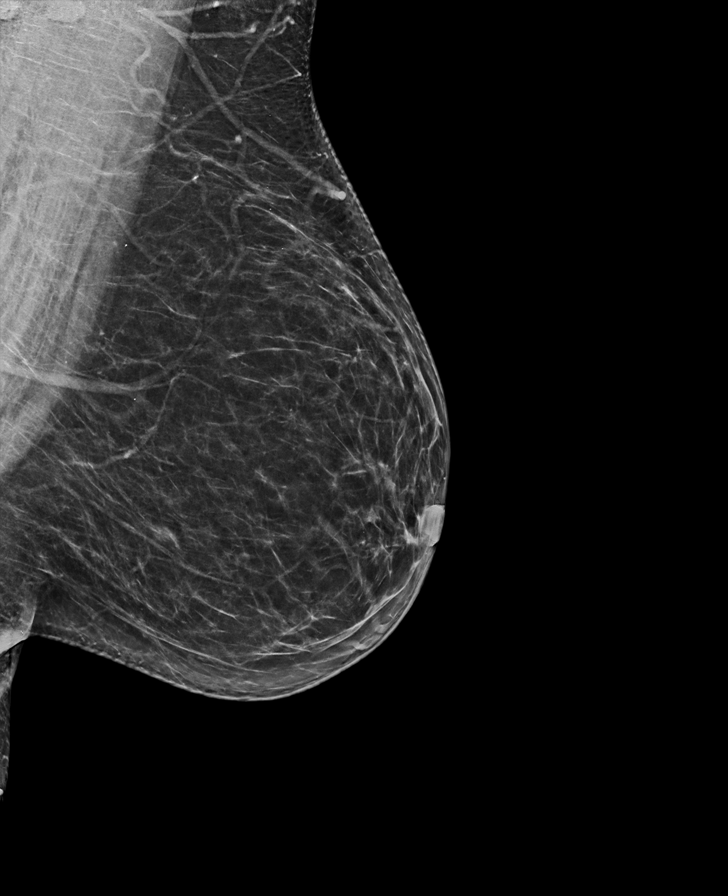

[R CC synth-2D]
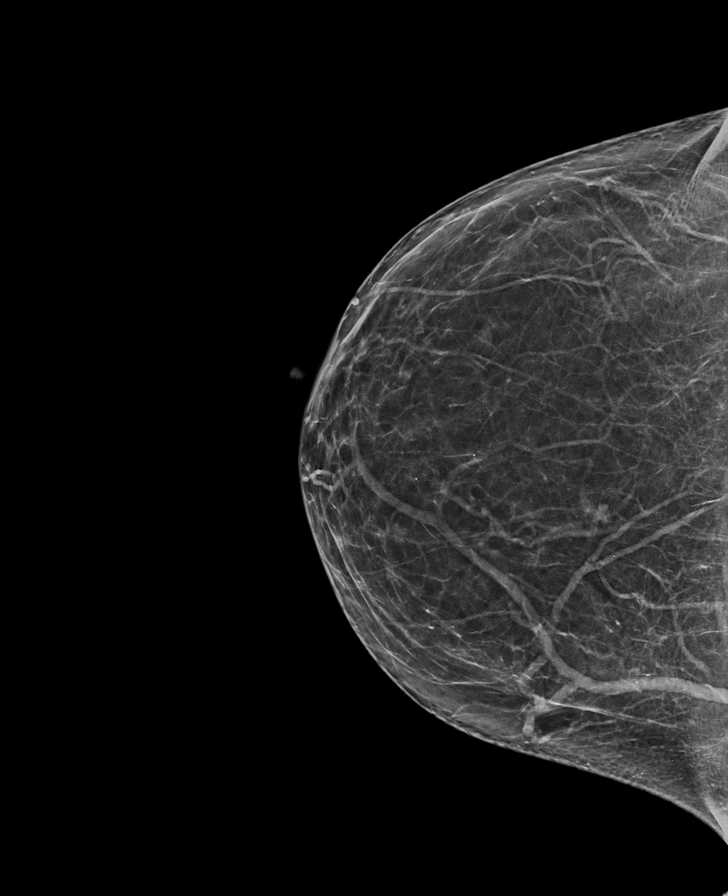

[R MLO synth-2D]
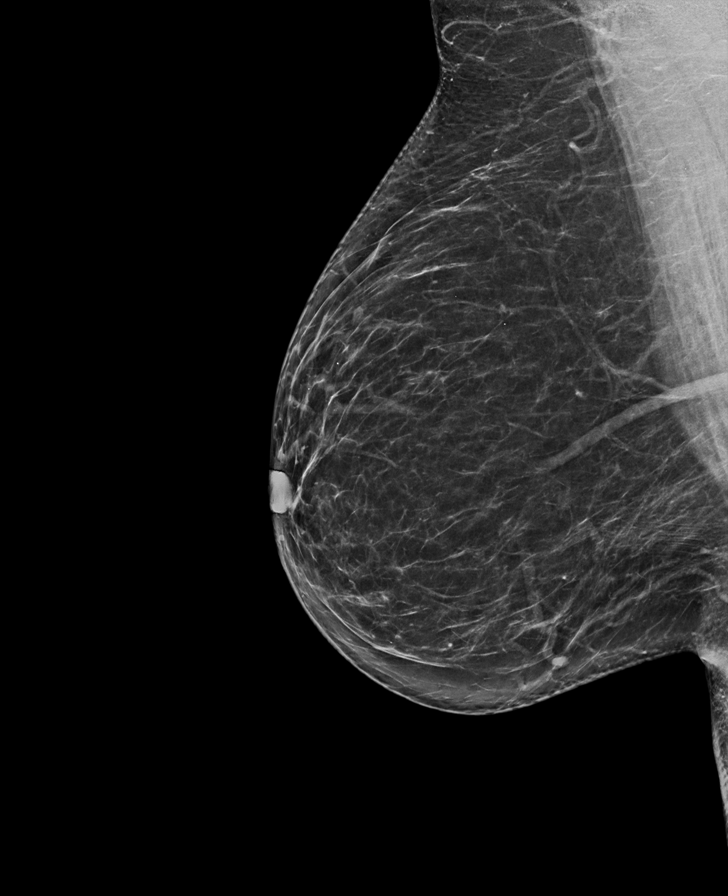

[R MLO tomo · tomo slice 39/78.0]
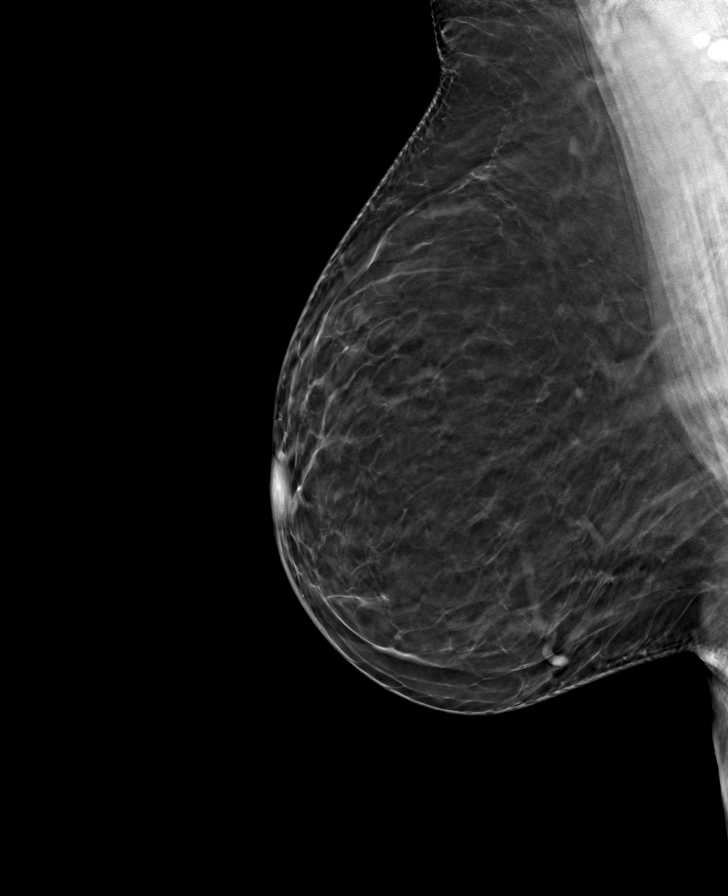

[L CC tomo · tomo slice 36/71.0]
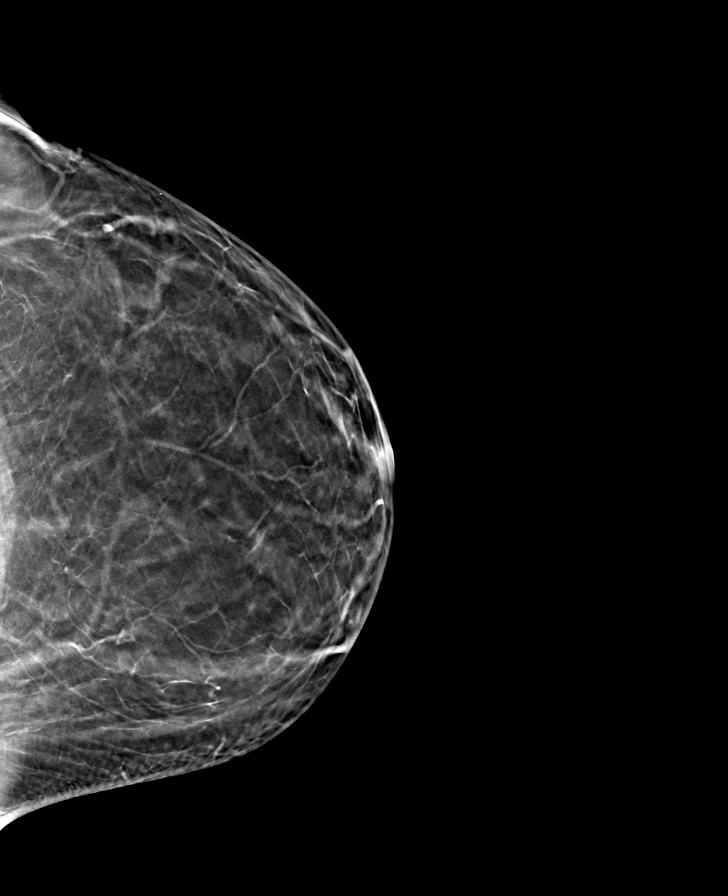

[L MLO tomo · tomo slice 38/75.0]
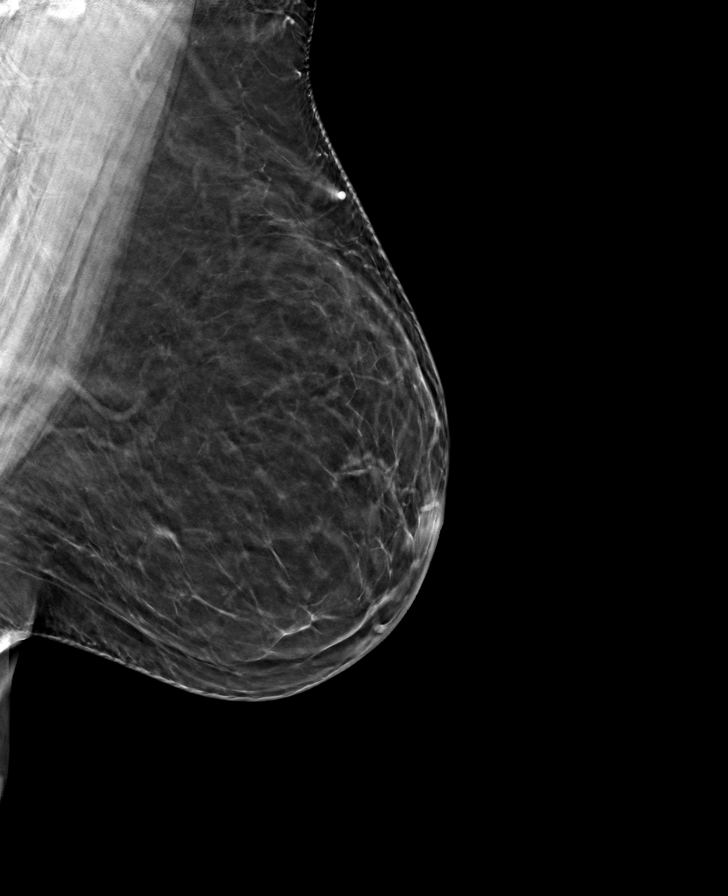

[R CC tomo · tomo slice 36/71.0]
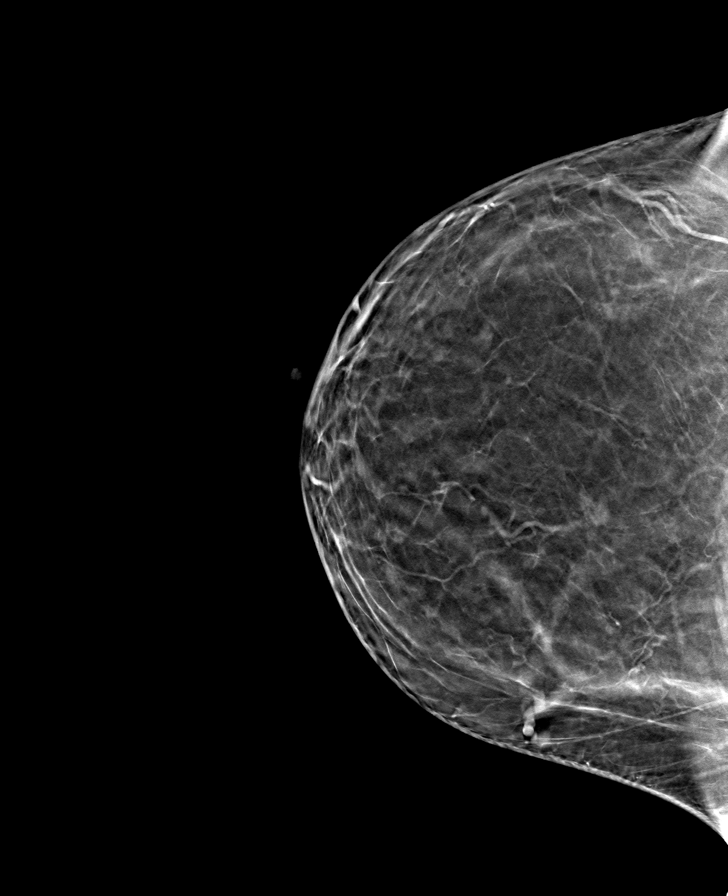

[8 of 24 positions shown; findings below may reference images not displayed]

ACR Breast Density Category b: There are scattered areas of
fibroglandular density.
FINDINGS: In the left breast, a possible asymmetry warrants further
evaluation. In the right breast, no findings suspicious for
malignancy.
IMPRESSION: Further evaluation is suggested for possible asymmetry in the left
breast.

RECOMMENDATION:
Diagnostic mammogram and possibly ultrasound of the left breast.
(Code:[id number])

The patient will be contacted regarding the findings, and additional
imaging will be scheduled.

BI-RADS CATEGORY  0: Incomplete. Need additional imaging evaluation
and/or prior mammograms for comparison.

## 2021-08-28 DIAGNOSIS — Z0289 Encounter for other administrative examinations: Secondary | ICD-10-CM

## 2021-08-29 ENCOUNTER — Ambulatory Visit (INDEPENDENT_AMBULATORY_CARE_PROVIDER_SITE_OTHER): Payer: Federal, State, Local not specified - PPO | Admitting: Family Medicine

## 2021-08-29 ENCOUNTER — Other Ambulatory Visit: Payer: Self-pay

## 2021-08-29 ENCOUNTER — Encounter (INDEPENDENT_AMBULATORY_CARE_PROVIDER_SITE_OTHER): Payer: Self-pay | Admitting: Family Medicine

## 2021-08-29 VITALS — BP 135/82 | HR 61 | Temp 98.1°F | Ht 67.0 in | Wt 294.0 lb

## 2021-08-29 DIAGNOSIS — R5383 Other fatigue: Secondary | ICD-10-CM | POA: Diagnosis not present

## 2021-08-29 DIAGNOSIS — F3289 Other specified depressive episodes: Secondary | ICD-10-CM

## 2021-08-29 DIAGNOSIS — Z9189 Other specified personal risk factors, not elsewhere classified: Secondary | ICD-10-CM | POA: Diagnosis not present

## 2021-08-29 DIAGNOSIS — R0602 Shortness of breath: Secondary | ICD-10-CM | POA: Diagnosis not present

## 2021-08-29 DIAGNOSIS — Z9884 Bariatric surgery status: Secondary | ICD-10-CM

## 2021-08-29 DIAGNOSIS — E038 Other specified hypothyroidism: Secondary | ICD-10-CM

## 2021-08-29 DIAGNOSIS — Z6841 Body Mass Index (BMI) 40.0 and over, adult: Secondary | ICD-10-CM

## 2021-08-29 DIAGNOSIS — G4733 Obstructive sleep apnea (adult) (pediatric): Secondary | ICD-10-CM

## 2021-08-29 DIAGNOSIS — E559 Vitamin D deficiency, unspecified: Secondary | ICD-10-CM | POA: Diagnosis not present

## 2021-08-29 NOTE — Progress Notes (Signed)
Dear Stacy Dun, FNP,   Thank you for referring Stacy Salazar to our clinic. The following note includes my evaluation and treatment recommendations.  Chief Complaint:   OBESITY Stacy Salazar (MR# [id number]) is a 60 y.o. female who presents for evaluation and treatment of obesity and related comorbidities. Current BMI is Body mass index is 46.05 kg/m. Stacy Salazar has been struggling with her weight for many years and has been unsuccessful in either losing weight, maintaining weight loss, or reaching her healthy weight goal.  Stacy Salazar is currently in the action stage of change and ready to dedicate time achieving and maintaining a healthier weight. Stacy Salazar is interested in becoming our patient and working on intensive lifestyle modifications including (but not limited to) diet and exercise for weight loss.  Referred by Stacy Dun, FNP. Stacy Salazar has a history of bariatric surgery in 2013 and still has some restriction. Breakfast- 2 egg omelet with vegetables + cheese + 1 slice toast with butter +/- fruit (feel full); 2-3 hours later- yogurt + fruit (schedule) (satisfied); Lunch- Protein shake Pure Protein) with 2% milk (satisfied); Afternoon snack- cottage cheese + fruit or cucumber; Supper- Pork chop (4 oz) + 1.5 cups mashed potatoes + 2 cups green beans or pasta- 2-3 cups homemade mac n cheese with 2 cups vegetables.  Stacy Salazar's habits were reviewed today and are as follows: her desired weight loss is 94 lbs, she has been heavy most of her life, she started gaining weight after the birth of her twins, her heaviest weight ever was 337 lbs 14.4 oz pounds, she has significant food cravings issues, she snacks frequently in the evenings, she is frequently drinking liquids with calories, she frequently makes poor food choices, she frequently eats larger portions than normal, she has binge eating behaviors, and she struggles with emotional eating.  Depression Screen Stacy Salazar's Food and Mood (modified PHQ-9)  score was 21.  Depression screen PHQ 2/9 08/29/2021  Decreased Interest 3  Down, Depressed, Hopeless 3  PHQ - 2 Score 6  Altered sleeping 2  Tired, decreased energy 3  Change in appetite 3  Feeling bad or failure about yourself  3  Trouble concentrating 2  Moving slowly or fidgety/restless 2  Suicidal thoughts 0  PHQ-9 Score 21  Difficult doing work/chores Somewhat difficult   Subjective:   1. Other fatigue Stacy Salazar admits to daytime somnolence and admits to waking up still tired. Patent has a history of symptoms of morning fatigue. Stacy Salazar generally gets 8 or 9 hours of sleep per night, and states that she has generally restful sleep. Snoring is present. Apneic episodes are not present. Epworth Sleepiness Score is 3.  2. SOB (shortness of breath) Stacy Salazar notes increasing shortness of breath with exercising and seems to be worsening over time with weight gain. She notes getting out of breath sooner with activity than she used to. This has gotten worse recently. Stacy Salazar denies shortness of breath at rest or orthopnea.  3. Other specified hypothyroidism Stacy Salazar is managed by Dr. Dorris Fetch in [place]. She is on levothyroxine 225 mcg. Labile TSH in the last 2 years.  4. Hx of gastric bypass Anemia panel was within normal limits in July. B12 within normal limits.  5. OSA (obstructive sleep apnea) Stacy Salazar has a CPAP and wears it with 100% compliance. Diagnosed 10 years ago.  6. Other depression Pt denies suicidal or homicidal ideations. Stacy Salazar was previously on medication (couldn't find med in [place]). Significant self deprecating talk and struggles with relationship with food.  7. Vitamin D deficiency Stacy Salazar is on OTC multivitamin. Her last Vit D level was 36.  8. At risk for deficient intake of food The patient is at a higher than average risk of deficient intake of food.  Assessment/Plan:   1. Other fatigue Stacy Salazar does feel that her weight is causing her energy to be lower than it should be.  Fatigue may be related to obesity, depression or many other causes. Labs will be ordered, and in the meanwhile, Zoriyah will focus on self care including making healthy food choices, increasing physical activity and focusing on stress reduction. Check labs today.  - EKG 12-Lead - Hemoglobin A1c - Insulin, random - Folate  2. SOB (shortness of breath) Stacy Salazar does feel that she gets out of breath more easily that she used to when she exercises. Stacy Salazar's shortness of breath appears to be obesity related and exercise induced. She has agreed to work on weight loss and gradually increase exercise to treat her exercise induced shortness of breath. Will continue to monitor closely. Check labs today.  - Lipid Panel With LDL/HDL Ratio  3. Other specified hypothyroidism Patient with long-standing hypothyroidism, on levothyroxine therapy. She appears euthyroid. Orders and follow up as documented in patient record. Follow up with Dr. Dorris Fetch. Check T3 today.  Counseling Good thyroid control is important for overall health. Supratherapeutic thyroid levels are dangerous and will not improve weight loss results. The correct way to take levothyroxine is fasting, with water, separated by at least 30 minutes from breakfast, and separated by more than 4 hours from calcium, iron, multivitamins, acid reflux medications (PPIs).   4. Hx of gastric bypass CMP and prealbumin today.  5. OSA (obstructive sleep apnea) Intensive lifestyle modifications are the first line treatment for this issue. We discussed several lifestyle modifications today and she will continue to work on diet, exercise and weight loss efforts. We will continue to monitor. Orders and follow up as documented in patient record. Follow up with Stacy Dun, FNP as needed.  6. Other depression Behavior modification techniques were discussed today to help Stacy Salazar deal with her emotional/non-hunger eating behaviors.  Orders and follow up as documented in  patient record. Refer to Dr. Mallie Mussel.  7. Vitamin D deficiency Low Vitamin D level contributes to fatigue and are associated with obesity, breast, and colon cancer. She agrees to continue to take OTC multivitamin daily and will follow-up for routine testing of Vitamin D, at least 2-3 times per year to avoid over-replacement. Check labs today.  - VITAMIN D 25 Hydroxy (Vit-D Deficiency, Fractures)  8. At risk for deficient intake of food Stacy Salazar was given approximately 15 minutes of deficit intake of food prevention counseling today. Stacy Salazar is at risk for eating too few calories based on current food recall. She was encouraged to focus on meeting caloric and protein goals according to her recommended meal plan.   9. Obesity with current BMI of 46.2  Stacy Salazar is currently in the action stage of change and her goal is to continue with weight loss efforts. I recommend Stacy Salazar begin the structured treatment plan as follows:  She has agreed to the Category 4 Plan.  Exercise goals: No exercise has been prescribed at this time.   Behavioral modification strategies: increasing lean protein intake, meal planning and cooking strategies, keeping healthy foods in the home, and emotional eating strategies.  She was informed of the importance of frequent follow-up visits to maximize her success with intensive lifestyle modifications for her multiple health conditions. She  was informed we would discuss her lab results at her next visit unless there is a critical issue that needs to be addressed sooner. Stacy Salazar agreed to keep her next visit at the agreed upon time to discuss these results.  Objective:   Blood pressure 135/82, pulse 61, temperature 98.1 F (36.7 C), height _0  (1.702 m), weight 294 lb (133.4 kg), SpO2 99 %. Body mass index is 46.05 kg/m.  EKG: Sinus rhythm, rate 57- bradycardia.  Indirect Calorimeter completed today shows a VO2 of 329 and a REE of 2275.  Her calculated basal metabolic rate is  4008 thus her basal metabolic rate is better than expected.  General: Cooperative, alert, well developed, in no acute distress. HEENT: Conjunctivae and lids unremarkable. Cardiovascular: Regular rhythm.  Lungs: Normal work of breathing. Neurologic: No focal deficits.   Lab Results  Component Value Date   CREATININE 0.66 06/27/2021   BUN 15 06/27/2021   NA 140 06/27/2021   K 5.0 06/27/2021   CL 102 06/27/2021   CO2 24 06/27/2021   Lab Results  Component Value Date   ALT 16 06/27/2021   AST 17 06/27/2021   ALKPHOS 91 06/27/2021   BILITOT 0.3 06/27/2021   No results found for: HGBA1C No results found for: INSULIN Lab Results  Component Value Date   TSH 4.210 06/27/2021   Lab Results  Component Value Date   CHOL 184 10/20/2019   HDL 64 10/20/2019   LDLCALC 95 10/20/2019   TRIG 148 10/20/2019   CHOLHDL 2.9 10/20/2019   Lab Results  Component Value Date   WBC 6.3 06/27/2021   HGB 13.3 06/27/2021   HCT 40.0 06/27/2021   MCV 93 06/27/2021   PLT 273 06/27/2021   Lab Results  Component Value Date   IRON 87 06/27/2021   TIBC 362 06/27/2021   FERRITIN 93 06/27/2021   Attestation Statements:   Reviewed by clinician on day of visit: allergies, medications, problem list, medical history, surgical history, family history, social history, and previous encounter notes.  Coral Ceo, CMA, am acting as transcriptionist for Coralie Common, MD.  This is the patient's first visit at [facility]. The patient's NEW PATIENT PACKET was reviewed at length. Included in the packet: current and past health history, medications, allergies, ROS, gynecologic history (women only), surgical history, family history, social history, weight history, weight loss surgery history (for those that have had weight loss surgery), nutritional evaluation, mood and food questionnaire, PHQ9, Epworth questionnaire, sleep habits questionnaire, patient life and health improvement goals  questionnaire. These will all be scanned into the patient's chart under media.   During the visit, I independently reviewed the patient's EKG, bioimpedance scale results, and indirect calorimeter results. I used this information to tailor a meal plan for the patient that will help her to lose weight and will improve her obesity-related conditions going forward. I performed a medically necessary appropriate examination and/or evaluation. I discussed the assessment and treatment plan with the patient. The patient was provided an opportunity to ask questions and all were answered. The patient agreed with the plan and demonstrated an understanding of the instructions. Labs were ordered at this visit and will be reviewed at the next visit unless more critical results need to be addressed immediately. Clinical information was updated and documented in the EMR.   Time spent on visit including pre-visit chart review and post-visit care was 45 minutes.   A separate 15 minutes was spent on risk counseling (see above).  I have reviewed the above documentation for accuracy and completeness, and I agree with the above. - Coralie Common, MD

## 2021-08-30 LAB — LIPID PANEL WITH LDL/HDL RATIO
Cholesterol, Total: 196 mg/dL (ref 100–199)
HDL: 58 mg/dL (ref >39)
LDL Chol Calc (NIH): 98 mg/dL (ref 0–99)
LDL/HDL Ratio: 1.7 ratio (ref 0.0–3.2)
Triglycerides: 238 mg/dL — ABNORMAL HIGH (ref 0–149)
VLDL Cholesterol Cal: 40 mg/dL (ref 5–40)

## 2021-08-30 LAB — HEMOGLOBIN A1C
Est. average glucose Bld gHb Est-mCnc: 114 mg/dL
Hgb A1c MFr Bld: 5.6 % (ref 4.8–5.6)

## 2021-08-30 LAB — INSULIN, RANDOM: INSULIN: 7.7 u[IU]/mL (ref 2.6–24.9)

## 2021-08-30 LAB — VITAMIN D 25 HYDROXY (VIT D DEFICIENCY, FRACTURES): Vit D, 25-Hydroxy: 26.2 ng/mL — ABNORMAL LOW (ref 30.0–100.0)

## 2021-08-30 LAB — FOLATE: Folate: >20 ng/mL (ref >3.0)

## 2021-09-12 ENCOUNTER — Ambulatory Visit (INDEPENDENT_AMBULATORY_CARE_PROVIDER_SITE_OTHER): Payer: Federal, State, Local not specified - PPO | Admitting: Family Medicine

## 2021-09-12 ENCOUNTER — Encounter (INDEPENDENT_AMBULATORY_CARE_PROVIDER_SITE_OTHER): Payer: Self-pay | Admitting: Family Medicine

## 2021-09-12 ENCOUNTER — Other Ambulatory Visit: Payer: Self-pay

## 2021-09-12 VITALS — BP 122/71 | HR 63 | Temp 98.2°F | Ht 67.0 in | Wt 285.0 lb

## 2021-09-12 DIAGNOSIS — E781 Pure hyperglyceridemia: Secondary | ICD-10-CM

## 2021-09-12 DIAGNOSIS — E559 Vitamin D deficiency, unspecified: Secondary | ICD-10-CM | POA: Diagnosis not present

## 2021-09-12 DIAGNOSIS — Z6841 Body Mass Index (BMI) 40.0 and over, adult: Secondary | ICD-10-CM

## 2021-09-12 DIAGNOSIS — Z9884 Bariatric surgery status: Secondary | ICD-10-CM | POA: Diagnosis not present

## 2021-09-12 DIAGNOSIS — F3289 Other specified depressive episodes: Secondary | ICD-10-CM

## 2021-09-12 DIAGNOSIS — Z9189 Other specified personal risk factors, not elsewhere classified: Secondary | ICD-10-CM

## 2021-09-12 DIAGNOSIS — E8881 Metabolic syndrome: Secondary | ICD-10-CM

## 2021-09-12 MED ORDER — VITAMIN D (ERGOCALCIFEROL) 1.25 MG (50000 UNIT) PO CAPS: 50000.0000 [IU] | ORAL_CAPSULE | ORAL | 0 refills | Status: DC

## 2021-09-12 NOTE — Progress Notes (Signed)
Chief Complaint:   OBESITY Stacy Salazar is here to discuss her progress with her obesity treatment plan along with follow-up of her obesity related diagnoses. Stacy Salazar is on the Category 4 Plan and states she is following her eating plan approximately 85% of the time. Stacy Salazar states she is walking 30-40 minutes 3 times per week.  Today's visit was #: 2 Starting weight: 294 lbs Starting date: 08/29/2021 Today's weight: 285 lbs Today's date: 09/12/2021 Total lbs lost to date: 9 Total lbs lost since last in-office visit: 9  Interim History: Stacy Salazar felt the diet went better than expected. She did have 1 episode of binge eating at night. She is wondering how to incorporate alcohol or wine into meal plan. She ate on plan for a day and then cheesy baked treat. No upcoming plans for the next few weeks.  Subjective:   1. Other depression, with emotional eating Stacy Salazar has not been scheduled with Dr. Dewaine Conger yet.  2. Hypertriglyceridemia Her triglycerides are 238, HDL 58, and LDL 98. She is not on statin therapy.  3. S/P bariatric surgery Pt is on 2 multivitamin daily and 2 iron supplements daily. She denies issue with quantity of food at dinner.  4. Vitamin D deficiency Stacy Salazar denies nausea, vomiting, and muscle weakness but notes fatigue. She is on prescription Vit D.  5. Insulin resistance Pt's A1c is 5.6 and insulin level 7.7. She reports minimal drive for sweets consistently.  6. At risk for osteoporosis Stacy Salazar is at higher risk of osteopenia and osteoporosis due to Vitamin D deficiency.   Assessment/Plan:   1. Other depression, with emotional eating Behavior modification techniques were discussed today to help Kierstin deal with her emotional/non-hunger eating behaviors.  Orders and follow up as documented in patient record. Refer to Dr. Dewaine Conger.  2. Hypertriglyceridemia Cardiovascular risk and specific lipid/LDL goals reviewed.  We discussed several lifestyle modifications today and Stacy Salazar will  continue to work on diet, exercise and weight loss efforts. Orders and follow up as documented in patient record. Repeat labs in 3 months.  Counseling Intensive lifestyle modifications are the first line treatment for this issue. Dietary changes: Increase soluble fiber. Decrease simple carbohydrates. Exercise changes: Moderate to vigorous-intensity aerobic activity 150 minutes per week if tolerated. Lipid-lowering medications: see documented in medical record.  3. S/P bariatric surgery Follow up labs in 3 months.  4. Vitamin D deficiency Low Vitamin D level contributes to fatigue and are associated with obesity, breast, and colon cancer. She agrees to continue to take prescription Vitamin D 50,000 IU every week and will follow-up for routine testing of Vitamin D, at least 2-3 times per year to avoid over-replacement.  Refill- Vitamin D, Ergocalciferol, (DRISDOL) 1.25 MG (50000 UNIT) CAPS capsule; Take 1 capsule (50,000 Units total) by mouth every 7 (seven) days.  Dispense: 4 capsule; Refill: 0  5. Insulin resistance Stacy Salazar will continue to work on weight loss, exercise, and decreasing simple carbohydrates to help decrease the risk of diabetes. Stacy Salazar agreed to follow-up with [place] as directed to closely monitor her progress. Repeat labs in 3 months.  6. At risk for osteoporosis Stacy Salazar was given approximately 30 minutes of osteoporosis prevention counseling today. Stacy Salazar is at risk for osteopenia and osteoporosis due to her Vitamin D deficiency. She was encouraged to take her Vitamin D and follow her higher calcium diet and increase strengthening exercise to help strengthen her bones and decrease her risk of osteopenia and osteoporosis.  Repetitive spaced learning was employed today to elicit  superior memory formation and behavioral change.  7. Obesity with current BMI of 44.8  Stacy Salazar is currently in the action stage of change. As such, her goal is to continue with weight loss efforts. She has  agreed to the Category 4 Plan.   Exercise goals:  As is  Behavioral modification strategies: increasing lean protein intake, meal planning and cooking strategies, keeping healthy foods in the home, and better snacking choices.  Stacy Salazar has agreed to follow-up with our clinic in 2 weeks. She was informed of the importance of frequent follow-up visits to maximize her success with intensive lifestyle modifications for her multiple health conditions.   Objective:   Blood pressure 122/71, pulse 63, temperature 98.2 F (36.8 C), height 5\' 7"  (1.702 m), weight 285 lb (129.3 kg), SpO2 97 %. Body mass index is 44.64 kg/m.  General: Cooperative, alert, well developed, in no acute distress. HEENT: Conjunctivae and lids unremarkable. Cardiovascular: Regular rhythm.  Lungs: Normal work of breathing. Neurologic: No focal deficits.   Lab Results  Component Value Date   CREATININE 0.66 06/27/2021   BUN 15 06/27/2021   NA 140 06/27/2021   K 5.0 06/27/2021   CL 102 06/27/2021   CO2 24 06/27/2021   Lab Results  Component Value Date   ALT 16 06/27/2021   AST 17 06/27/2021   ALKPHOS 91 06/27/2021   BILITOT 0.3 06/27/2021   Lab Results  Component Value Date   HGBA1C 5.6 08/29/2021   Lab Results  Component Value Date   INSULIN 7.7 08/29/2021   Lab Results  Component Value Date   TSH 4.210 06/27/2021   Lab Results  Component Value Date   CHOL 196 08/29/2021   HDL 58 08/29/2021   LDLCALC 98 08/29/2021   TRIG 238 (H) 08/29/2021   CHOLHDL 2.9 10/20/2019   Lab Results  Component Value Date   VD25OH 26.2 (L) 08/29/2021   VD25OH 36 11/05/2020   VD25OH 23 (L) 05/02/2020   Lab Results  Component Value Date   WBC 6.3 06/27/2021   HGB 13.3 06/27/2021   HCT 40.0 06/27/2021   MCV 93 06/27/2021   PLT 273 06/27/2021   Lab Results  Component Value Date   IRON 87 06/27/2021   TIBC 362 06/27/2021   FERRITIN 93 06/27/2021    Attestation Statements:   Reviewed by clinician on day  of visit: allergies, medications, problem list, medical history, surgical history, family history, social history, and previous encounter notes.  08/28/2021, CMA, am acting as transcriptionist for Edmund Hilda, MD.   I have reviewed the above documentation for accuracy and completeness, and I agree with the above. - Reuben Likes, MD

## 2021-09-20 DIAGNOSIS — Z23 Encounter for immunization: Secondary | ICD-10-CM | POA: Diagnosis not present
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
## 2021-09-25 ENCOUNTER — Encounter (INDEPENDENT_AMBULATORY_CARE_PROVIDER_SITE_OTHER): Payer: Self-pay

## 2021-09-26 ENCOUNTER — Encounter (INDEPENDENT_AMBULATORY_CARE_PROVIDER_SITE_OTHER): Payer: Self-pay

## 2021-09-26 ENCOUNTER — Ambulatory Visit (INDEPENDENT_AMBULATORY_CARE_PROVIDER_SITE_OTHER): Payer: Federal, State, Local not specified - PPO | Admitting: Family Medicine

## 2021-09-27 ENCOUNTER — Ambulatory Visit: Payer: Federal, State, Local not specified - PPO | Admitting: Family

## 2021-10-07 ENCOUNTER — Telehealth (INDEPENDENT_AMBULATORY_CARE_PROVIDER_SITE_OTHER): Payer: Federal, State, Local not specified - PPO | Admitting: Psychology

## 2021-10-07 DIAGNOSIS — F5089 Other specified eating disorder: Secondary | ICD-10-CM

## 2021-10-09 ENCOUNTER — Ambulatory Visit: Payer: Federal, State, Local not specified - PPO | Admitting: Family

## 2021-10-09 ENCOUNTER — Encounter: Payer: Self-pay | Admitting: Family

## 2021-10-09 ENCOUNTER — Other Ambulatory Visit: Payer: Self-pay

## 2021-10-09 VITALS — BP 129/79 | HR 64 | Temp 97.5°F | Ht 67.0 in | Wt 279.6 lb

## 2021-10-09 DIAGNOSIS — G2581 Restless legs syndrome: Secondary | ICD-10-CM

## 2021-10-09 DIAGNOSIS — Z9884 Bariatric surgery status: Secondary | ICD-10-CM

## 2021-10-09 DIAGNOSIS — G4733 Obstructive sleep apnea (adult) (pediatric): Secondary | ICD-10-CM

## 2021-10-09 DIAGNOSIS — D509 Iron deficiency anemia, unspecified: Secondary | ICD-10-CM | POA: Diagnosis not present

## 2021-10-09 DIAGNOSIS — Z23 Encounter for immunization: Secondary | ICD-10-CM

## 2021-10-09 DIAGNOSIS — E038 Other specified hypothyroidism: Secondary | ICD-10-CM

## 2021-10-09 DIAGNOSIS — L719 Rosacea, unspecified: Secondary | ICD-10-CM | POA: Diagnosis not present

## 2021-10-09 MED ORDER — METRONIDAZOLE 0.75 % EX GEL: CUTANEOUS | 5 refills | Status: DC

## 2021-10-09 NOTE — Patient Instructions (Signed)
High Triglycerides Eating Plan Triglycerides are a type of fat in the blood. High levels of triglycerides can increase your risk of heart disease and stroke. If your triglyceride levels are high, choosing the right foods can help lower your triglycerides and keep your heart healthy. Work with your health care provider or a diet and nutrition specialist (dietitian) to develop an eating plan that is right for you. What are tips for following this plan? General guidelines  Lose weight, if you are overweight. For most people, losing 5-10 lbs (2-5 kg) helps lower triglyceride levels. A weight-loss plan may include. 30 minutes of exercise at least 5 days a week. Reducing the amount of calories, sugar, and fat you eat. Eat a wide variety of fresh fruits, vegetables, and whole grains. These foods are high in fiber. Eat foods that contain healthy fats, such as fatty fish, nuts, seeds, and olive oil. Avoid foods that are high in added sugar, added salt (sodium), saturated fat, and trans fat. Avoid low-fiber, refined carbohydrates such as white bread, crackers, noodles, and white rice. Avoid foods with partially hydrogenated oils (trans fats), such as fried foods or stick margarine. Limit alcohol intake to no more than 1 drink a day for nonpregnant women and 2 drinks a day for men. One drink equals 12 oz of beer, 5 oz of wine, or 1 oz of hard liquor. Your health care provider may recommend that you drink less depending on your overall health.  Reading food labels Check food labels for the amount of saturated fat. Choose foods with no or very little saturated fat. Check food labels for the amount of trans fat. Choose foods with no trans fat. Check food labels for the amount of cholesterol. Choose foods low in cholesterol. Ask your dietitian how much cholesterol you should have each day. Check food labels for the amount of sodium. Choose foods with less than 140 milligrams (mg) per serving. Shopping Buy  dairy products labeled as nonfat (skim) or low-fat (1%). Avoid buying processed or prepackaged foods. These are often high in added sugar, sodium, and fat. Cooking Choose healthy fats when cooking, such as olive oil or canola oil. Cook foods using lower fat methods, such as baking, broiling, boiling, or grilling. Make your own sauces, dressings, and marinades when possible, instead of buying them. Store-bought sauces, dressings, and marinades are often high in sodium and sugar. Meal planning Eat more home-cooked food and less restaurant, buffet, and fast food. Eat fatty fish at least 2 times each week. Examples of fatty fish include salmon, trout, mackerel, tuna, and herring. If you eat whole eggs, do not eat more than 3 egg yolks per week. What foods are recommended? The items listed may not be a complete list. Talk with your dietitian aboutwhat dietary choices are best for you. Grains Whole wheat or whole grain breads, crackers, cereals, and pasta. Unsweetenedoatmeal. Bulgur. Barley. Quinoa. Brown rice. Whole wheat flour tortillas. Vegetables Fresh or frozen vegetables. Low-sodium canned vegetables. Fruits All fresh, canned (in natural juice), or frozen fruits. Meats and other protein foods Skinless chicken or turkey. Ground chicken or turkey. Lean cuts of pork, trimmed of fat. Fish and seafood, especially salmon, trout, and herring. Egg whites. Dried beans, peas, or lentils. Unsalted nuts or seeds. Unsalted cannedbeans. Natural peanut or almond butter. Dairy Low-fat dairy products. Skim or low-fat (1%) milk. Reduced fat (2%) and low-sodium cheese. Low-fat ricotta cheese. Low-fat cottage cheese. Plain,low-fat yogurt. Fats and oils Tub margarine without trans fats. Light or reduced-fat   mayonnaise. Light or reduced-fat salad dressings.Avocado. Safflower, olive, sunflower, soybean, and canola oils. What foods are not recommended? The items listed may not be a complete list. Talk with your  dietitian aboutwhat dietary choices are best for you. Grains White bread. White (regular) pasta. White rice. Cornbread. Bagels. Pastries. Crackers that contain trans fat. Vegetables Creamed or fried vegetables. Vegetables in a cheese sauce. Fruits Sweetened dried fruit. Canned fruit in syrup. Fruit juice. Meats and other protein foods Fatty cuts of meat. Ribs. Chicken wings. Bacon. Sausage. Bologna. Salami.Chitterlings. Fatback. Hot dogs. Bratwurst. Packaged lunch meats. Dairy Whole or reduced-fat (2%) milk. Half-and-half. Cream cheese. Full-fat or sweetened yogurt. Full-fat cheese. Nondairy creamers. Whipped toppings.Processed cheese or cheese spreads. Cheese curds. Beverages Alcohol. Sweetened drinks, such as soda, lemonade, fruit drinks, or punches. Fats and oils Butter. Stick margarine. Lard. Shortening. Ghee. Bacon fat. Tropical oils, suchas coconut, palm kernel, or palm oils. Sweets and desserts Corn syrup. Sugars. Honey. Molasses. Candy. Jam and jelly. Syrup. Sweetenedcereals. Cookies. Pies. Cakes. Donuts. Muffins. Ice cream. Condiments Store-bought sauces, dressings, and marinades that are high in sugar, such asketchup and barbecue sauce. Summary High levels of triglycerides can increase the risk of heart disease and stroke. Choosing the right foods can help lower your triglycerides. Eat plenty of fresh fruits, vegetables, and whole grains. Choose low-fat dairy and lean meats. Eat fatty fish at least twice a week. Avoid processed and prepackaged foods with added sugar, sodium, saturated fat, and trans fat. If you need suggestions or have questions about what types of food are good for you, talk with your health care provider or a dietitian. This information is not intended to replace advice given to you by your health care provider. Make sure you discuss any questions you have with your healthcare provider. Document Revised: 04/09/2020 Document Reviewed: 04/11/2020 Elsevier Patient  Education  2022 [employer] Inc[employer]  

## 2021-10-09 NOTE — Progress Notes (Signed)
Subjective:    Patient ID: Stacy Salazar, female    DOB: 1961-11-04, 60 y.o.   MRN: [id number]  Chief Complaint  Patient presents with   Medical Management of Chronic Issues    Flu shot today    PT presents to the office chronic follow up. She is currently going to [facility] 2 weeks. She has hx of gastric bypass.   She is followed by Endocrinologists every 6 months. She has rosacea and uses metrogel as needed.  Thyroid Problem Presents for follow-up visit. Patient reports no anxiety, depressed mood, dry skin, fatigue or hoarse voice. The symptoms have been stable.  Anemia Presents for follow-up visit. There has been no confusion or malaise/fatigue.  OSA  Using CPAP nightly. Stable.  RLS Pt taking pramipexole and doing being stable.   Review of Systems  Constitutional:  Negative for fatigue and malaise/fatigue.  HENT:  Negative for hoarse voice.   Psychiatric/Behavioral:  Negative for confusion. The patient is not nervous/anxious.   All other systems reviewed and are negative.     Objective:   Physical Exam Vitals reviewed.  Constitutional:      General: She is not in acute distress.    Appearance: She is well-developed. She is obese.  HENT:     Head: Normocephalic and atraumatic.     Right Ear: Tympanic membrane normal.     Left Ear: Tympanic membrane normal.  Eyes:     Pupils: Pupils are equal, round, and reactive to light.  Neck:     Thyroid: No thyromegaly.  Cardiovascular:     Rate and Rhythm: Normal rate and regular rhythm.     Heart sounds: Normal heart sounds. No murmur heard. Pulmonary:     Effort: Pulmonary effort is normal. No respiratory distress.     Breath sounds: Normal breath sounds. No wheezing.  Abdominal:     General: Bowel sounds are normal. There is no distension.     Palpations: Abdomen is soft.     Tenderness: There is no abdominal tenderness.  Musculoskeletal:        General: No tenderness. Normal range of motion.      Cervical back: Normal range of motion and neck supple.  Skin:    General: Skin is warm and dry.  Neurological:     Mental Status: She is alert and oriented to person, place, and time.     Cranial Nerves: No cranial nerve deficit.     Deep Tendon Reflexes: Reflexes are normal and symmetric.  Psychiatric:        Behavior: Behavior normal.        Thought Content: Thought content normal.        Judgment: Judgment normal.      BP 129/79   Pulse 64   Temp (!) 97.5 F (36.4 C) (Temporal)   Ht 5\' 7"  (1.702 m)   Wt 279 lb 9.6 oz (126.8 kg)   BMI 43.79 kg/m      Assessment & Plan:  Stacy Salazar comes in today with chief complaint of Medical Management of Chronic Issues (Flu shot today )   Diagnosis and orders addressed:  1. Rosacea - metroNIDAZOLE (METROGEL) 0.75 % gel; APPLY A THIN LAYER OF GEL TOPICALLY TWICE DAILY  Dispense: 45 g; Refill: 5  2. Need for immunization against influenza - Flu Vaccine QUAD 66mo+IM (Fluarix, Fluzone & Alfiuria Quad PF)  3. Iron deficiency anemia, unspecified iron deficiency anemia type  4. Morbid obesity (HCC)  5. Restless  leg syndrome  6. Hx of gastric bypass  7. Other specified hypothyroidism  8. OSA (obstructive sleep apnea)   Labs pending Health Maintenance reviewed Diet and exercise encouraged  Follow up plan: 6 months    Jannifer Rodney, FNP

## 2021-10-23 ENCOUNTER — Ambulatory Visit (INDEPENDENT_AMBULATORY_CARE_PROVIDER_SITE_OTHER): Payer: Federal, State, Local not specified - PPO | Admitting: Family Medicine

## 2021-10-23 ENCOUNTER — Other Ambulatory Visit: Payer: Self-pay

## 2021-10-23 ENCOUNTER — Encounter (INDEPENDENT_AMBULATORY_CARE_PROVIDER_SITE_OTHER): Payer: Self-pay | Admitting: Family Medicine

## 2021-10-23 VITALS — BP 120/75 | HR 66 | Temp 98.2°F | Ht 67.0 in | Wt 277.0 lb

## 2021-10-23 DIAGNOSIS — Z6841 Body Mass Index (BMI) 40.0 and over, adult: Secondary | ICD-10-CM | POA: Diagnosis not present

## 2021-10-23 DIAGNOSIS — E8881 Metabolic syndrome: Secondary | ICD-10-CM | POA: Diagnosis not present

## 2021-10-23 DIAGNOSIS — E559 Vitamin D deficiency, unspecified: Secondary | ICD-10-CM | POA: Diagnosis not present

## 2021-10-23 MED ORDER — VITAMIN D (ERGOCALCIFEROL) 1.25 MG (50000 UNIT) PO CAPS: 50000.0000 [IU] | ORAL_CAPSULE | ORAL | 0 refills | Status: DC

## 2021-10-23 NOTE — Progress Notes (Signed)
Chief Complaint:   OBESITY Stacy Salazar is here to discuss her progress with her obesity treatment plan along with follow-up of her obesity related diagnoses. Stacy Salazar is on the Category 4 Plan and states she is following her eating plan approximately 50% of the time. Stacy Salazar states she is walking 20-30 minutes 5 times per week.  Today's visit was #: 3 Starting weight: 294 lbs Starting date: 08/29/2021 Today's weight: 277 lbs Today's date: 10/23/2021 Total lbs lost to date: 17 Total lbs lost since last in-office visit: 8  Interim History: Stacy Salazar has started seeing Dr. Dewaine Conger and has noticed a decline in thoughts about food and cravings. She had to go out of town for a little over a week and found it a little harder to stick with the plan when away. Over the next few weeks, she has Thanksgiving and is going to her sister-in-law's mother's house.  Subjective:   1. Vitamin D deficiency Pt denies nausea, vomiting, and muscle weakness but notes fatigue. She is on prescription Vit D.  2. Insulin resistance Stacy Salazar's last A1c was 5.6 and insulin level 7.7. She is not on medication.  Assessment/Plan:   1. Vitamin D deficiency Low Vitamin D level contributes to fatigue and are associated with obesity, breast, and colon cancer. She agrees to continue to take prescription Vitamin D 50,000 IU every week and will follow-up for routine testing of Vitamin D, at least 2-3 times per year to avoid over-replacement.  Refill- Vitamin D, Ergocalciferol, (DRISDOL) 1.25 MG (50000 UNIT) CAPS capsule; Take 1 capsule (50,000 Units total) by mouth every 7 (seven) days.  Dispense: 4 capsule; Refill: 0  2. Insulin resistance Stacy Salazar will continue to work on weight loss, exercise, and decreasing simple carbohydrates to help decrease the risk of diabetes. Stacy Salazar agreed to follow-up with [place] as directed to closely monitor her progress. Follow up labs in Feb 2023.  3. Obesity BMI today is 58  Stacy Salazar is currently in the action  stage of change. As such, her goal is to continue with weight loss efforts. She has agreed to the Category 4 Plan.   Exercise goals:  As is  Behavioral modification strategies: increasing lean protein intake, meal planning and cooking strategies, and keeping healthy foods in the home.  Stacy Salazar has agreed to follow-up with our clinic in 3 weeks. She was informed of the importance of frequent follow-up visits to maximize her success with intensive lifestyle modifications for her multiple health conditions.   Objective:   Blood pressure 120/75, pulse 66, temperature 98.2 F (36.8 C), height 5\' 7"  (1.702 m), weight 277 lb (125.6 kg), SpO2 96 %. Body mass index is 43.38 kg/m.  General: Cooperative, alert, well developed, in no acute distress. HEENT: Conjunctivae and lids unremarkable. Cardiovascular: Regular rhythm.  Lungs: Normal work of breathing. Neurologic: No focal deficits.   Lab Results  Component Value Date   CREATININE 0.66 06/27/2021   BUN 15 06/27/2021   NA 140 06/27/2021   K 5.0 06/27/2021   CL 102 06/27/2021   CO2 24 06/27/2021   Lab Results  Component Value Date   ALT 16 06/27/2021   AST 17 06/27/2021   ALKPHOS 91 06/27/2021   BILITOT 0.3 06/27/2021   Lab Results  Component Value Date   HGBA1C 5.6 08/29/2021   Lab Results  Component Value Date   INSULIN 7.7 08/29/2021   Lab Results  Component Value Date   TSH 4.210 06/27/2021   Lab Results  Component Value Date  CHOL 196 08/29/2021   HDL 58 08/29/2021   LDLCALC 98 08/29/2021   TRIG 238 (H) 08/29/2021   CHOLHDL 2.9 10/20/2019   Lab Results  Component Value Date   VD25OH 26.2 (L) 08/29/2021   VD25OH 36 11/05/2020   VD25OH 23 (L) 05/02/2020   Lab Results  Component Value Date   WBC 6.3 06/27/2021   HGB 13.3 06/27/2021   HCT 40.0 06/27/2021   MCV 93 06/27/2021   PLT 273 06/27/2021   Lab Results  Component Value Date   IRON 87 06/27/2021   TIBC 362 06/27/2021   FERRITIN 93 06/27/2021    Attestation Statements:   Reviewed by clinician on day of visit: allergies, medications, problem list, medical history, surgical history, family history, social history, and previous encounter notes.  Edmund Hilda, CMA, am acting as transcriptionist for Reuben Likes, MD.   I have reviewed the above documentation for accuracy and completeness, and I agree with the above. - Reuben Likes, MD

## 2021-11-11 ENCOUNTER — Other Ambulatory Visit: Payer: Self-pay | Admitting: "Endocrinology

## 2021-11-18 ENCOUNTER — Ambulatory Visit (INDEPENDENT_AMBULATORY_CARE_PROVIDER_SITE_OTHER): Payer: Federal, State, Local not specified - PPO | Admitting: Family Medicine

## 2021-12-02 ENCOUNTER — Ambulatory Visit: Payer: Federal, State, Local not specified - PPO | Admitting: "Endocrinology

## 2021-12-30 ENCOUNTER — Telehealth: Payer: Federal, State, Local not specified - PPO | Admitting: Physician Assistant

## 2021-12-30 DIAGNOSIS — J208 Acute bronchitis due to other specified organisms: Secondary | ICD-10-CM

## 2021-12-30 DIAGNOSIS — B9689 Other specified bacterial agents as the cause of diseases classified elsewhere: Secondary | ICD-10-CM

## 2021-12-30 MED ORDER — DOXYCYCLINE HYCLATE 100 MG PO TABS: 100.0000 mg | ORAL_TABLET | Freq: Two times a day (BID) | ORAL | 0 refills | Status: DC

## 2021-12-30 MED ORDER — BENZONATATE 100 MG PO CAPS: 100.0000 mg | ORAL_CAPSULE | Freq: Three times a day (TID) | ORAL | 0 refills | Status: DC | PRN

## 2021-12-30 NOTE — Progress Notes (Signed)
I have spent 5 minutes in review of e-visit questionnaire, review and updating patient chart, medical decision making and response to patient.   Deeksha Cotrell Cody Abir Craine, PA-C    

## 2021-12-30 NOTE — Progress Notes (Signed)

## 2022-01-07 ENCOUNTER — Other Ambulatory Visit: Payer: Self-pay

## 2022-01-07 DIAGNOSIS — E039 Hypothyroidism, unspecified: Secondary | ICD-10-CM

## 2022-01-07 DIAGNOSIS — E559 Vitamin D deficiency, unspecified: Secondary | ICD-10-CM

## 2022-01-09 DIAGNOSIS — E559 Vitamin D deficiency, unspecified: Secondary | ICD-10-CM | POA: Diagnosis not present

## 2022-01-09 DIAGNOSIS — E039 Hypothyroidism, unspecified: Secondary | ICD-10-CM | POA: Diagnosis not present

## 2022-01-10 LAB — TSH: TSH: 6.98 u[IU]/mL — ABNORMAL HIGH (ref 0.450–4.500)

## 2022-01-10 LAB — T4, FREE: Free T4: 1.08 ng/dL (ref 0.82–1.77)

## 2022-01-10 LAB — VITAMIN D 25 HYDROXY (VIT D DEFICIENCY, FRACTURES): Vit D, 25-Hydroxy: 23.7 ng/mL — ABNORMAL LOW (ref 30.0–100.0)

## 2022-01-17 ENCOUNTER — Other Ambulatory Visit: Payer: Self-pay

## 2022-01-17 ENCOUNTER — Ambulatory Visit: Payer: Federal, State, Local not specified - PPO | Admitting: "Endocrinology

## 2022-01-17 ENCOUNTER — Encounter: Payer: Self-pay | Admitting: "Endocrinology

## 2022-01-17 VITALS — BP 116/72 | HR 60 | Ht 67.0 in | Wt 288.0 lb

## 2022-01-17 DIAGNOSIS — E782 Mixed hyperlipidemia: Secondary | ICD-10-CM

## 2022-01-17 DIAGNOSIS — E559 Vitamin D deficiency, unspecified: Secondary | ICD-10-CM | POA: Diagnosis not present

## 2022-01-17 DIAGNOSIS — E039 Hypothyroidism, unspecified: Secondary | ICD-10-CM

## 2022-01-17 MED ORDER — SYNTHROID 200 MCG PO TABS: 200.0000 ug | ORAL_TABLET | Freq: Every day | ORAL | 1 refills | Status: DC

## 2022-01-17 MED ORDER — VITAMIN D (ERGOCALCIFEROL) 1.25 MG (50000 UNIT) PO CAPS: 50000.0000 [IU] | ORAL_CAPSULE | ORAL | 0 refills | Status: DC

## 2022-01-17 MED ORDER — SYNTHROID 50 MCG PO TABS: 50.0000 ug | ORAL_TABLET | Freq: Every day | ORAL | 1 refills | Status: DC

## 2022-01-17 NOTE — Progress Notes (Signed)
01/17/2022, 1:04 PM                Endocrinology follow-up note   Stacy Salazar is a 61 y.o.-year-old female patient being seen in follow-up for  Hypothyroidism.  PCP: Junie Spencer, FNP.   Past Medical History:  Diagnosis Date   Anemia    B12 deficiency    Bilateral swelling of feet    Gallbladder problem    Hypertension    Hypothyroidism    Joint pain    Morbid obesity (HCC)    RLS (restless legs syndrome)    Rosacea    Sleep apnea    SOB (shortness of breath)    Varicose veins of bilateral lower extremities with other complications    Vitamin D deficiency     Past Surgical History:  Procedure Laterality Date   CARPAL TUNNEL RELEASE Left    CESAREAN SECTION  1993   CHOLECYSTECTOMY  2016   ENDOVENOUS ABLATION SAPHENOUS VEIN W/ LASER Left 08/11/2019   endovenous laser ablation left gretaer saphenous vein and stab phlebectomy 10-20 incisions left leg by Cari Caraway MD    ENDOVENOUS ABLATION SAPHENOUS VEIN W/ LASER Right 08/25/2019   endovenous laser ablation right greater saphenous vein by Cari Caraway MD    GASTRIC BYPASS  12/23/2011   LASIK      Social History   Socioeconomic History   Marital status: Divorced    Spouse name: Not on file   Number of children: Not on file   Years of education: Not on file   Highest education level: Not on file  Occupational History   Not on file  Tobacco Use   Smoking status: Never   Smokeless tobacco: Never  Vaping Use   Vaping Use: Never used  Substance and Sexual Activity   Alcohol use: Not Currently    Comment: Glass of wine monthly   Drug use: Never   Sexual activity: Not Currently  Other Topics Concern   Not on file  Social History Narrative   Not on file   Social Determinants of Health   Financial Resource Strain: Not on file  Food Insecurity: Not on file  Transportation Needs: Not on file  Physical Activity: Not on file   Stress: Not on file  Social Connections: Not on file    Family History  Problem Relation Age of Onset   Alcohol abuse Mother    Cancer Mother    Cirrhosis Mother    Alcoholism Mother    Stroke Father    Hypertension Father    Heart disease Father    Breast cancer Neg Hx     Outpatient Encounter Medications as of 01/17/2022  Medication Sig   Cholecalciferol (VITAMIN D3 PO) Take 1 tablet by mouth daily in the afternoon.   SYNTHROID 50 MCG tablet Take 1 tablet (50 mcg total) by mouth daily before breakfast.   CALCIUM CITRATE PO Take 500 mg by mouth 2 (two) times daily.   Ferrous Sulfate (IRON PO) Take by mouth.   metroNIDAZOLE (METROGEL) 0.75 % gel APPLY  A THIN LAYER OF GEL TOPICALLY TWICE DAILY   Multiple Vitamins-Minerals (MULTIVITAMIN ADULTS PO) Take by mouth.   pramipexole (MIRAPEX) 0.5 MG tablet Take 3 tablets by mouth at bedtime.   SYNTHROID 200 MCG tablet Take 1 tablet (200 mcg total) by mouth daily before breakfast.   Vitamin D, Ergocalciferol, (DRISDOL) 1.25 MG (50000 UNIT) CAPS capsule Take 1 capsule (50,000 Units total) by mouth every 7 (seven) days.   [DISCONTINUED] benzonatate (TESSALON) 100 MG capsule Take 1 capsule (100 mg total) by mouth 3 (three) times daily as needed for cough.   [DISCONTINUED] doxycycline (VIBRA-TABS) 100 MG tablet Take 1 tablet (100 mg total) by mouth 2 (two) times daily.   [DISCONTINUED] SYNTHROID 200 MCG tablet TAKE 1 TABLET BY MOUTH ONCE DAILY BEFORE BREAKFAST   [DISCONTINUED] SYNTHROID 25 MCG tablet TAKE 1 TABLET BY MOUTH BEFORE BREAKFAST. TAKE ALONG WITH 200 MCG TABLET FOR TOTAL DOSE OF 225 MCG   [DISCONTINUED] Vitamin D, Ergocalciferol, (DRISDOL) 1.25 MG (50000 UNIT) CAPS capsule Take 1 capsule (50,000 Units total) by mouth every 7 (seven) days. (Patient not taking: Reported on 01/17/2022)   No facility-administered encounter medications on file as of 01/17/2022.    ALLERGIES: No Active Allergies  VACCINATION STATUS: Immunization History   Administered Date(s) Administered   Influenza,inj,Quad PF,6+ Mos 10/14/2012, 10/29/2018, 08/30/2019, 11/05/2020, 10/09/2021   PFIZER(Purple Top)SARS-COV-2 Vaccination 03/15/2020, 04/07/2020, 11/02/2020, 09/20/2021   Tdap 05/02/2019   Zoster Recombinat (Shingrix) 01/06/2020, 03/19/2020   Zoster, Live 06/26/2013     HPI    Stacy Salazar  is a patient with the above medical history. she was diagnosed  with hypothyroidism at approximate age of 30 years. She was subsequently initiated on levothyroxine replacement.  she was given various doses of levothyroxine over the years.  During her last visit, her levothyroxine was adjusted at 225 mcg p.o. daily before breakfast.   She continued to feel better.  She has no new complaints today.  She has trouble losing weight.  She still reports fatigue, wears her CPAP machine more nights than not.   She reports compliance to her medication.  Her previsit thyroid function tests are consistent with slight under replacement.      Pt denies feeling nodules in neck, hoarseness, dysphagia/odynophagia, SOB with lying down.  she denies family history of  thyroid disorders, no family history of thyroid malignancy.  No history of  radiation therapy to head or neck, no history of thyroid surgery.  I reviewed her chart and she also has a history of obesity, status post gastric bypass in 2013.  She also has sleep apnea, restless leg syndrome.   ROS: Limited as above.   Physical Exam: BP 116/72    Pulse 60    Ht 5\' 7"  (1.702 m)    Wt 288 lb (130.6 kg)    BMI 45.11 kg/m  Wt Readings from Last 3 Encounters:  01/17/22 288 lb (130.6 kg)  10/23/21 277 lb (125.6 kg)  10/09/21 279 lb 9.6 oz (126.8 kg)      CMP ( most recent) CMP     Component Value Date/Time   NA 140 06/27/2021 1041   K 5.0 06/27/2021 1041   CL 102 06/27/2021 1041   CO2 24 06/27/2021 1041   GLUCOSE 89 06/27/2021 1041   BUN 15 06/27/2021 1041   CREATININE 0.66 06/27/2021 1041    CALCIUM 9.6 06/27/2021 1041   PROT 6.9 06/27/2021 1041   ALBUMIN 4.6 06/27/2021 1041   AST 17 06/27/2021 1041   ALT  16 06/27/2021 1041   ALKPHOS 91 06/27/2021 1041   BILITOT 0.3 06/27/2021 1041   GFRNONAA 102 07/12/2020 0947   GFRAA 117 07/12/2020 0947     Lipid Panel     Component Value Date/Time   CHOL 196 08/29/2021 0945   TRIG 238 (H) 08/29/2021 0945   HDL 58 08/29/2021 0945   CHOLHDL 2.9 10/20/2019 1003   LDLCALC 98 08/29/2021 0945   LABVLDL 40 08/29/2021 0945       Lab Results  Component Value Date   TSH 6.980 (H) 01/09/2022   TSH 4.210 06/27/2021   TSH 3.940 05/13/2021   TSH 12.44 (H) 11/05/2020   TSH 3.97 05/02/2020   TSH 1.50 01/02/2020   TSH 12.300 (H) 10/20/2019   TSH 0.685 05/02/2019   TSH 2.450 10/29/2018   FREET4 1.08 01/09/2022   FREET4 1.37 05/13/2021   FREET4 1.1 11/05/2020   FREET4 1.3 05/02/2020   FREET4 1.6 01/02/2020       ASSESSMENT: 1. Hypothyroidism 2.  Vitamin D deficiency 3.  Obesity 4.  Dyslipidemia PLAN:    Patient with long-standing hypothyroidism.  Her previsit thyroid function tests are consistent with slight under replacement.  I discussed and increase her levothyroxine to 250 mcg p.o. daily before breakfast.     - We discussed about the correct intake of her thyroid hormone, on empty stomach at fasting, with water, separated by at least 30 minutes from breakfast and other medications,  and separated by more than 4 hours from calcium, iron, multivitamins, acid reflux medications (PPIs). -Patient is made aware of the fact that thyroid hormone replacement is needed for life, dose to be adjusted by periodic monitoring of thyroid function tests.    -Due to absence of clinical goiter, no need for thyroid ultrasound.  Given her clinical presentation with recent weight gain, and history of dyslipidemia, she will benefit from lifestyle medicine.    - she acknowledges that there is a room for improvement in her food and drink  choices. - Suggestion is made for her to avoid simple carbohydrates  from her diet including Cakes, Sweet Desserts, Ice Cream, Soda (diet and regular), Sweet Tea, Candies, Chips, Cookies, Store Bought Juices, Alcohol , Artificial Sweeteners,  Coffee Creamer, and "Sugar-free" Products, Lemonade. This will help patient to have more stable blood glucose profile and potentially avoid unintended weight gain.  The following Lifestyle Medicine recommendations according to American College of Lifestyle Medicine  [facility]) were discussed and and offered to patient and she  agrees to start the journey:  A. Whole Foods, Plant-Based Nutrition comprising of fruits and vegetables, plant-based proteins, whole-grain carbohydrates was discussed in detail with the patient.   A list for source of those nutrients were also provided to the patient.  Patient will use only water or unsweetened tea for hydration. B.  The need to stay away from risky substances including alcohol, smoking; obtaining 7 to 9 hours of restorative sleep, at least 150 minutes of moderate intensity exercise weekly, the importance of healthy social connections,  and stress management techniques were discussed. C.  A full color page of  Calorie density of various food groups per pound showing examples of each food groups was provided to the patient.    -Her 25-hydroxy vitamin D is dropping again to 23.  She is advised to resume vitamin D2 50,000 units weekly for the next 90 days.  She is advised to continue her current maintenance vitamin D3 5000 units daily.     She  is advised to maintain close follow-up with her PCP Jannifer Rodneyhristy Hawks, FNP.    I spent 31 minutes in the care of the patient today including review of labs from Thyroid Function, CMP, and other relevant labs ; imaging/biopsy records (current and previous including abstractions from other facilities); face-to-face time discussing  her lab results and symptoms, medications doses, her options of  short and long term treatment based on the latest standards of care / guidelines;   and documenting the encounter.  Stacy Salazar  participated in the discussions, expressed understanding, and voiced agreement with the above plans.  All questions were answered to her satisfaction. she is encouraged to contact clinic should she have any questions or concerns prior to her return visit.      Return in about 6 months (around 07/17/2022) for F/U with Pre-visit Labs, A1c -NV.  Marquis LunchGebre Promise Bushong, MD [facility] [address] [place], [place] [address] Phone: [phone]  Fax: [phone]   01/17/2022, 1:04 PM  This note was partially dictated with voice recognition software. Similar sounding words can be transcribed inadequately or may not  be corrected upon review.

## 2022-01-17 NOTE — Patient Instructions (Signed)

## 2022-03-05 ENCOUNTER — Telehealth: Payer: Self-pay | Admitting: "Endocrinology

## 2022-03-05 MED ORDER — PRAMIPEXOLE DIHYDROCHLORIDE 0.5 MG PO TABS: 1.5000 mg | ORAL_TABLET | Freq: Every day | ORAL | 2 refills | Status: DC

## 2022-03-05 NOTE — Telephone Encounter (Signed)
Patient called and said she does not need you to call her back, she's gotten it figured out ?

## 2022-03-05 NOTE — Telephone Encounter (Signed)
Patient would like you to call back ?

## 2022-03-05 NOTE — Telephone Encounter (Signed)
Talked with pt, advised her the only codes we would add to her prescription would be her diagnosis code and she needed to speak with her pharmacist regarding the different code that would need to be submitted to the insurance company. Pt voiced understanding, stated she would call her pharmacy. ?

## 2022-03-05 NOTE — Telephone Encounter (Signed)
Noted  

## 2022-03-05 NOTE — Telephone Encounter (Signed)
Patient states that she switched her [employer] plan during the year last year and now her thyroid medication is more expensive. She said that her insurance told her if that the prescription's were coded as code 5 that she could get the brand name for the generic price. She would like you to call her as well. Thank you! ? ?[place] [facility]  ?

## 2022-04-08 DIAGNOSIS — M25561 Pain in right knee: Secondary | ICD-10-CM | POA: Diagnosis not present

## 2022-04-09 ENCOUNTER — Other Ambulatory Visit: Payer: Self-pay

## 2022-04-09 ENCOUNTER — Other Ambulatory Visit: Payer: Self-pay | Admitting: Family

## 2022-04-09 ENCOUNTER — Telehealth: Payer: Self-pay | Admitting: Family

## 2022-04-09 DIAGNOSIS — Z1211 Encounter for screening for malignant neoplasm of colon: Secondary | ICD-10-CM

## 2022-04-09 DIAGNOSIS — Z1231 Encounter for screening mammogram for malignant neoplasm of breast: Secondary | ICD-10-CM

## 2022-04-09 NOTE — Telephone Encounter (Signed)
Patient notified that she does need a screening mammogram done this year because they found something suspicious last year. She verbalized understanding and also would like a referral to colonoscopy. Referral placed ?

## 2022-04-09 NOTE — Telephone Encounter (Signed)
Patient is wanting to have a coloscopy scheduled and wanted to know if she is due for a mammogram. She said that she thought she was due for one this year but on MyChart it says she does not need one until next year. Please call back and advise.  ?

## 2022-04-15 ENCOUNTER — Encounter: Payer: Self-pay | Admitting: Internal Medicine

## 2022-04-16 ENCOUNTER — Telehealth: Payer: Self-pay | Admitting: Family

## 2022-05-16 ENCOUNTER — Ambulatory Visit
Admission: RE | Admit: 2022-05-16 | Discharge: 2022-05-16 | Disposition: A | Discharge status: Home / Self Care | Payer: Federal, State, Local not specified - PPO | Source: Ambulatory Visit | Attending: Family | Admitting: Family

## 2022-05-16 ENCOUNTER — Ambulatory Visit: Payer: Federal, State, Local not specified - PPO

## 2022-05-16 DIAGNOSIS — Z1231 Encounter for screening mammogram for malignant neoplasm of breast: Secondary | ICD-10-CM | POA: Diagnosis not present

## 2022-05-22 DIAGNOSIS — M25571 Pain in right ankle and joints of right foot: Secondary | ICD-10-CM | POA: Diagnosis not present

## 2022-06-19 ENCOUNTER — Ambulatory Visit: Payer: Federal, State, Local not specified - PPO

## 2022-06-19 ENCOUNTER — Other Ambulatory Visit (HOSPITAL_COMMUNITY): Payer: Self-pay | Admitting: Family Medicine

## 2022-06-19 DIAGNOSIS — M79662 Pain in left lower leg: Secondary | ICD-10-CM | POA: Diagnosis not present

## 2022-06-19 DIAGNOSIS — M79604 Pain in right leg: Secondary | ICD-10-CM

## 2022-06-19 DIAGNOSIS — S93401A Sprain of unspecified ligament of right ankle, initial encounter: Secondary | ICD-10-CM | POA: Diagnosis not present

## 2022-06-19 DIAGNOSIS — M79605 Pain in left leg: Secondary | ICD-10-CM | POA: Diagnosis not present

## 2022-06-20 ENCOUNTER — Ambulatory Visit (HOSPITAL_COMMUNITY)
Admission: RE | Admit: 2022-06-20 | Discharge: 2022-06-20 | Disposition: A | Discharge status: Home / Self Care | Payer: Federal, State, Local not specified - PPO | Source: Ambulatory Visit | Attending: Family Medicine | Admitting: Family Medicine

## 2022-06-20 DIAGNOSIS — M79604 Pain in right leg: Secondary | ICD-10-CM | POA: Insufficient documentation

## 2022-06-20 DIAGNOSIS — M7989 Other specified soft tissue disorders: Secondary | ICD-10-CM | POA: Insufficient documentation

## 2022-07-02 ENCOUNTER — Ambulatory Visit: Payer: Federal, State, Local not specified - PPO

## 2022-07-14 DIAGNOSIS — E559 Vitamin D deficiency, unspecified: Secondary | ICD-10-CM | POA: Diagnosis not present

## 2022-07-14 DIAGNOSIS — E039 Hypothyroidism, unspecified: Secondary | ICD-10-CM | POA: Diagnosis not present

## 2022-07-15 LAB — T4, FREE: Free T4: 1.49 ng/dL (ref 0.82–1.77)

## 2022-07-15 LAB — COMPREHENSIVE METABOLIC PANEL
ALT: 21 IU/L (ref 0–32)
AST: 21 IU/L (ref 0–40)
Albumin/Globulin Ratio: 2 (ref 1.2–2.2)
Albumin: 4.1 g/dL (ref 3.9–4.9)
Alkaline Phosphatase: 77 IU/L (ref 44–121)
BUN/Creatinine Ratio: 14 (ref 12–28)
BUN: 9 mg/dL (ref 8–27)
Bilirubin Total: 0.4 mg/dL (ref 0.0–1.2)
CO2: 25 mmol/L (ref 20–29)
Calcium: 9.2 mg/dL (ref 8.7–10.3)
Chloride: 102 mmol/L (ref 96–106)
Creatinine, Ser: 0.63 mg/dL (ref 0.57–1.00)
Globulin, Total: 2.1 g/dL (ref 1.5–4.5)
Glucose: 101 mg/dL — ABNORMAL HIGH (ref 70–99)
Potassium: 4.6 mmol/L (ref 3.5–5.2)
Sodium: 140 mmol/L (ref 134–144)
Total Protein: 6.2 g/dL (ref 6.0–8.5)
eGFR: 101 mL/min/{1.73_m2} (ref >59)

## 2022-07-15 LAB — LIPID PANEL
Chol/HDL Ratio: 2.8 ratio (ref 0.0–4.4)
Cholesterol, Total: 165 mg/dL (ref 100–199)
HDL: 59 mg/dL (ref >39)
LDL Chol Calc (NIH): 77 mg/dL (ref 0–99)
Triglycerides: 175 mg/dL — ABNORMAL HIGH (ref 0–149)
VLDL Cholesterol Cal: 29 mg/dL (ref 5–40)

## 2022-07-15 LAB — TSH: TSH: 0.827 u[IU]/mL (ref 0.450–4.500)

## 2022-07-15 LAB — VITAMIN D 25 HYDROXY (VIT D DEFICIENCY, FRACTURES): Vit D, 25-Hydroxy: 42.1 ng/mL (ref 30.0–100.0)

## 2022-07-18 ENCOUNTER — Encounter: Payer: Self-pay | Admitting: "Endocrinology

## 2022-07-18 ENCOUNTER — Ambulatory Visit: Payer: Federal, State, Local not specified - PPO | Admitting: "Endocrinology

## 2022-07-18 VITALS — BP 122/82 | HR 68 | Ht 67.0 in | Wt 259.4 lb

## 2022-07-18 DIAGNOSIS — R7303 Prediabetes: Secondary | ICD-10-CM

## 2022-07-18 DIAGNOSIS — E559 Vitamin D deficiency, unspecified: Secondary | ICD-10-CM

## 2022-07-18 DIAGNOSIS — E039 Hypothyroidism, unspecified: Secondary | ICD-10-CM | POA: Diagnosis not present

## 2022-07-18 DIAGNOSIS — E782 Mixed hyperlipidemia: Secondary | ICD-10-CM | POA: Diagnosis not present

## 2022-07-18 LAB — POCT GLYCOSYLATED HEMOGLOBIN (HGB A1C): HbA1c, POC (controlled diabetic range): 5.1 % (ref 0.0–7.0)

## 2022-07-18 NOTE — Progress Notes (Signed)
07/18/2022, 9:00 AM                Endocrinology follow-up note   Stacy Salazar is a 61 y.o.-year-old female patient being seen in follow-up for Hypothyroidism, weight management, vitamin D deficiency, hyperlipidemia. PCP: Junie Spencer, FNP.   Past Medical History:  Diagnosis Date   Anemia    B12 deficiency    Bilateral swelling of feet    Gallbladder problem    Hypertension    Hypothyroidism    Joint pain    Morbid obesity (HCC)    RLS (restless legs syndrome)    Rosacea    Sleep apnea    SOB (shortness of breath)    Varicose veins of bilateral lower extremities with other complications    Vitamin D deficiency     Past Surgical History:  Procedure Laterality Date   CARPAL TUNNEL RELEASE Left    CESAREAN SECTION  1993   CHOLECYSTECTOMY  2016   ENDOVENOUS ABLATION SAPHENOUS VEIN W/ LASER Left 08/11/2019   endovenous laser ablation left gretaer saphenous vein and stab phlebectomy 10-20 incisions left leg by Cari Caraway MD    ENDOVENOUS ABLATION SAPHENOUS VEIN W/ LASER Right 08/25/2019   endovenous laser ablation right greater saphenous vein by Cari Caraway MD    GASTRIC BYPASS  12/23/2011   LASIK      Social History   Socioeconomic History   Marital status: Divorced    Spouse name: Not on file   Number of children: Not on file   Years of education: Not on file   Highest education level: Not on file  Occupational History   Not on file  Tobacco Use   Smoking status: Never   Smokeless tobacco: Never  Vaping Use   Vaping Use: Never used  Substance and Sexual Activity   Alcohol use: Not Currently    Comment: Glass of wine monthly   Drug use: Never   Sexual activity: Not Currently  Other Topics Concern   Not on file  Social History Narrative   Not on file   Social Determinants of Health   Financial Resource Strain: Not on file  Food Insecurity: Not on file  Transportation  Needs: Not on file  Physical Activity: Not on file  Stress: Not on file  Social Connections: Not on file    Family History  Problem Relation Age of Onset   Alcohol abuse Mother    Cancer Mother    Cirrhosis Mother    Alcoholism Mother    Stroke Father    Hypertension Father    Heart disease Father    Breast cancer Neg Hx     Outpatient Encounter Medications as of 07/18/2022  Medication Sig   GLUCOSAMINE-CHONDROITIN PO Take 2 tablets by mouth daily.   TURMERIC CURCUMIN PO Take 1 tablet by mouth daily.   CALCIUM CITRATE PO Take 500 mg by mouth 2 (two) times daily.   Cholecalciferol (VITAMIN D3 PO) Take 1 tablet by mouth daily in the afternoon.   Ferrous Sulfate (IRON PO) Take by mouth.  metroNIDAZOLE (METROGEL) 0.75 % gel APPLY A THIN LAYER OF GEL TOPICALLY TWICE DAILY   Multiple Vitamins-Minerals (MULTIVITAMIN ADULTS PO) Take by mouth.   pramipexole (MIRAPEX) 0.5 MG tablet Take 3 tablets (1.5 mg total) by mouth at bedtime.   SYNTHROID 200 MCG tablet Take 1 tablet (200 mcg total) by mouth daily before breakfast.   SYNTHROID 50 MCG tablet Take 1 tablet (50 mcg total) by mouth daily before breakfast.   [DISCONTINUED] Vitamin D, Ergocalciferol, (DRISDOL) 1.25 MG (50000 UNIT) CAPS capsule Take 1 capsule (50,000 Units total) by mouth every 7 (seven) days.   No facility-administered encounter medications on file as of 07/18/2022.    ALLERGIES: No Active Allergies  VACCINATION STATUS: Immunization History  Administered Date(s) Administered   Influenza,inj,Quad PF,6+ Mos 10/14/2012, 10/29/2018, 08/30/2019, 11/05/2020, 10/09/2021   PFIZER(Purple Top)SARS-COV-2 Vaccination 03/15/2020, 04/07/2020, 11/02/2020, 09/20/2021   Tdap 05/02/2019   Zoster Recombinat (Shingrix) 01/06/2020, 03/19/2020   Zoster, Live 06/26/2013     HPI    Stacy Salazar  is a patient with the above medical history. she was diagnosed  with hypothyroidism at approximate age of 30 years. She was subsequently  initiated on levothyroxine replacement.  she was given various doses of levothyroxine over the years.  During her last visit, her levothyroxine was adjusted at 250 mcg p.o. daily before breakfast.  She continues to feel better.  More importantly, she has made some dietary changes based on lifestyle medicine discussions we had during her last visit.  She presents with 29 pounds of weight loss.  She feels better.  She has no new complaints today.    She still reports fatigue, wears her CPAP machine more nights than not.   She reports compliance to her medication.      Pt denies feeling nodules in neck, hoarseness, dysphagia/odynophagia, SOB with lying down.  she denies family history of  thyroid disorders, no family history of thyroid malignancy.  No history of  radiation therapy to head or neck, no history of thyroid surgery.  I reviewed her chart and she also has a history of obesity, status post gastric bypass in 2013.  She also has sleep apnea, restless leg syndrome.   ROS: Limited as above.   Physical Exam: BP 122/82   Pulse 68   Ht 5\' 7"  (1.702 m)   Wt 259 lb 6.4 oz (117.7 kg)   BMI 40.63 kg/m  Wt Readings from Last 3 Encounters:  07/18/22 259 lb 6.4 oz (117.7 kg)  01/17/22 288 lb (130.6 kg)  10/23/21 277 lb (125.6 kg)      CMP ( most recent) CMP     Component Value Date/Time   NA 140 07/14/2022 0819   K 4.6 07/14/2022 0819   CL 102 07/14/2022 0819   CO2 25 07/14/2022 0819   GLUCOSE 101 (H) 07/14/2022 0819   BUN 9 07/14/2022 0819   CREATININE 0.63 07/14/2022 0819   CALCIUM 9.2 07/14/2022 0819   PROT 6.2 07/14/2022 0819   ALBUMIN 4.1 07/14/2022 0819   AST 21 07/14/2022 0819   ALT 21 07/14/2022 0819   ALKPHOS 77 07/14/2022 0819   BILITOT 0.4 07/14/2022 0819   GFRNONAA 102 07/12/2020 0947   GFRAA 117 07/12/2020 0947     Lipid Panel     Component Value Date/Time   CHOL 165 07/14/2022 0819   TRIG 175 (H) 07/14/2022 0819   HDL 59 07/14/2022 0819   CHOLHDL  2.8 07/14/2022 0819   LDLCALC 77 07/14/2022 0819   LABVLDL 29  07/14/2022 0819       Lab Results  Component Value Date   TSH 0.827 07/14/2022   TSH 6.980 (H) 01/09/2022   TSH 4.210 06/27/2021   TSH 3.940 05/13/2021   TSH 12.44 (H) 11/05/2020   TSH 3.97 05/02/2020   TSH 1.50 01/02/2020   TSH 12.300 (H) 10/20/2019   TSH 0.685 05/02/2019   TSH 2.450 10/29/2018   FREET4 1.49 07/14/2022   FREET4 1.08 01/09/2022   FREET4 1.37 05/13/2021   FREET4 1.1 11/05/2020   FREET4 1.3 05/02/2020   FREET4 1.6 01/02/2020       ASSESSMENT: 1. Hypothyroidism 2.  Vitamin D deficiency 3.  Obesity 4.  Dyslipidemia 5.  Prediabetes PLAN:    Patient with long-standing hypothyroidism.  Her previsit thyroid function tests are consistent with appropriate replacement.  She is advised to continue   levothyroxine to 250 mcg p.o. daily before breakfast.     - We discussed about the correct intake of her thyroid hormone, on empty stomach at fasting, with water, separated by at least 30 minutes from breakfast and other medications,  and separated by more than 4 hours from calcium, iron, multivitamins, acid reflux medications (PPIs). -Patient is made aware of the fact that thyroid hormone replacement is needed for life, dose to be adjusted by periodic monitoring of thyroid function tests.     -Due to absence of clinical goiter, no need for thyroid ultrasound.  Given her metabolic syndrome indicated by morbid obesity, dyslipidemia, dysglycemia, sleep apnea, she was given brief lifestyle medicine package during her last visit, presents with significant improvement in her profile with 29 pounds of weight loss in the interval.   She will benefit from more engagement in lifestyle medicine.  She stands to benefit significantly more from lifestyle medicine. - she acknowledges that there is a room for improvement in her food and drink choices. - Suggestion is made for her to avoid simple carbohydrates  from her  diet including Cakes, Sweet Desserts, Ice Cream, Soda (diet and regular), Sweet Tea, Candies, Chips, Cookies, Store Bought Juices, Alcohol , Artificial Sweeteners,  Coffee Creamer, and "Sugar-free" Products, Lemonade. This will help patient to have more stable blood glucose profile and potentially avoid unintended weight gain.  The following Lifestyle Medicine recommendations according to American College of Lifestyle Medicine  [facility]) were discussed and and offered to patient and she  agrees to start the journey:  A. Whole Foods, Plant-Based Nutrition comprising of fruits and vegetables, plant-based proteins, whole-grain carbohydrates was discussed in detail with the patient.   A list for source of those nutrients were also provided to the patient.  Patient will use only water or unsweetened tea for hydration. B.  The need to stay away from risky substances including alcohol, smoking; obtaining 7 to 9 hours of restorative sleep, at least 150 minutes of moderate intensity exercise weekly, the importance of healthy social connections,  and stress management techniques were discussed. C.  A full color page of  Calorie density of various food groups per pound showing examples of each food groups was provided to the patient.   -Her 25-hydroxy vitamin D is corrected at 42.  She is advised to continue vitamin D3 5000 units daily.   Her A1c today point-of-care is 5.1%, improving from 5.7% consistent with prediabetes.   She is advised to maintain close follow-up with her PCP Jannifer Rodney, FNP.   I spent 32 minutes in the care of the patient today including review of labs from  Thyroid Function, CMP, and other relevant labs ; imaging/biopsy records (current and previous including abstractions from other facilities); face-to-face time discussing  her lab results and symptoms, medications doses, her options of short and long term treatment based on the latest standards of care / guidelines;   and documenting the  encounter.  Wayna Chalet  participated in the discussions, expressed understanding, and voiced agreement with the above plans.  All questions were answered to her satisfaction. she is encouraged to contact clinic should she have any questions or concerns prior to her return visit.    Return in about 3 months (around 10/18/2022) for F/U with Pre-visit Labs.  Marquis Lunch, MD [facility] [address] [place], [place] [address] Phone: [phone]  Fax: [phone]   07/18/2022, 9:00 AM  This note was partially dictated with voice recognition software. Similar sounding words can be transcribed inadequately or may not  be corrected upon review.

## 2022-07-18 NOTE — Patient Instructions (Signed)

## 2022-07-25 ENCOUNTER — Ambulatory Visit (INDEPENDENT_AMBULATORY_CARE_PROVIDER_SITE_OTHER): Payer: Federal, State, Local not specified - PPO | Admitting: Family

## 2022-07-25 ENCOUNTER — Encounter: Payer: Self-pay | Admitting: Family

## 2022-07-25 VITALS — BP 126/79 | HR 61 | Temp 97.2°F | Ht 67.0 in | Wt 264.2 lb

## 2022-07-25 DIAGNOSIS — Z9884 Bariatric surgery status: Secondary | ICD-10-CM

## 2022-07-25 DIAGNOSIS — Z0000 Encounter for general adult medical examination without abnormal findings: Secondary | ICD-10-CM

## 2022-07-25 DIAGNOSIS — E038 Other specified hypothyroidism: Secondary | ICD-10-CM

## 2022-07-25 DIAGNOSIS — S0006XA Insect bite (nonvenomous) of scalp, initial encounter: Secondary | ICD-10-CM

## 2022-07-25 DIAGNOSIS — R609 Edema, unspecified: Secondary | ICD-10-CM

## 2022-07-25 DIAGNOSIS — Z0001 Encounter for general adult medical examination with abnormal findings: Secondary | ICD-10-CM | POA: Diagnosis not present

## 2022-07-25 DIAGNOSIS — S93401A Sprain of unspecified ligament of right ankle, initial encounter: Secondary | ICD-10-CM

## 2022-07-25 DIAGNOSIS — D509 Iron deficiency anemia, unspecified: Secondary | ICD-10-CM | POA: Diagnosis not present

## 2022-07-25 DIAGNOSIS — L719 Rosacea, unspecified: Secondary | ICD-10-CM | POA: Diagnosis not present

## 2022-07-25 DIAGNOSIS — G4733 Obstructive sleep apnea (adult) (pediatric): Secondary | ICD-10-CM | POA: Diagnosis not present

## 2022-07-25 DIAGNOSIS — W57XXXA Bitten or stung by nonvenomous insect and other nonvenomous arthropods, initial encounter: Secondary | ICD-10-CM

## 2022-07-25 DIAGNOSIS — E559 Vitamin D deficiency, unspecified: Secondary | ICD-10-CM

## 2022-07-25 DIAGNOSIS — S96911A Strain of unspecified muscle and tendon at ankle and foot level, right foot, initial encounter: Secondary | ICD-10-CM

## 2022-07-25 DIAGNOSIS — E782 Mixed hyperlipidemia: Secondary | ICD-10-CM

## 2022-07-25 DIAGNOSIS — E538 Deficiency of other specified B group vitamins: Secondary | ICD-10-CM

## 2022-07-25 MED ORDER — PREDNISONE 10 MG (21) PO TBPK: ORAL_TABLET | ORAL | 0 refills | Status: DC

## 2022-07-25 NOTE — Progress Notes (Signed)
Subjective:    Patient ID: Stacy Salazar, female    DOB: 31-Dec-1960, 61 y.o.   MRN: [id number]  Chief Complaint  Patient presents with   Insect Bite        Abdominal Pain   Pt presents to the office today for CPE without pap. She is currently going to [facility] weight loss. She has lost 30 lbs.  She has hx of gastric bypass.    She is followed by Endocrinologists every 6 months. She has rosacea and uses metrogel as needed.    States she was outside 5 days ago and a bug flew in her hair and was biting her. States she is having swelling on her forehead, scalp. Noticed a lymph node on her left side is swollen. Started Allegra 3 days ago that has helped.   Has OSA and uses CPAP nightly. Stable. She is morbid obese with a BMI of 41.   She fell May 27th and went Ortho. Diagnosed with a sprain, but continues to have moderate swelling. She had a negative [place] that was negative for DVT.  Anemia Presents for follow-up visit. There has been no fever, malaise/fatigue or pica.  Hyperlipidemia This is a chronic problem. The current episode started more than 1 year ago. The problem is uncontrolled. Exacerbating diseases include obesity. Current antihyperlipidemic treatment includes diet change. The current treatment provides mild improvement of lipids. Risk factors for coronary artery disease include dyslipidemia, hypertension, a sedentary lifestyle and post-menopausal.  Thyroid Problem Presents for follow-up visit. Patient reports no anxiety, constipation, depressed mood, diarrhea, fatigue, hoarse voice or weight gain. The symptoms have been stable. Her past medical history is significant for hyperlipidemia.  Rash This is a new problem. The current episode started in the past 7 days. The problem has been gradually improving since onset. The affected locations include the scalp and face. The rash is characterized by itchiness, pain and redness. She was exposed to an insect bite/sting. Pertinent  negatives include no diarrhea, fatigue or fever.      Review of Systems  Constitutional:  Negative for fatigue, fever, malaise/fatigue and weight gain.  HENT:  Negative for hoarse voice.   Gastrointestinal:  Negative for constipation and diarrhea.  Skin:  Positive for rash.  Psychiatric/Behavioral:  The patient is not nervous/anxious.   All other systems reviewed and are negative.  Family History  Problem Relation Age of Onset   Alcohol abuse Mother    Cancer Mother    Cirrhosis Mother    Alcoholism Mother    Stroke Father    Hypertension Father    Heart disease Father    Breast cancer Neg Hx    Social History   Socioeconomic History   Marital status: Divorced    Spouse name: Not on file   Number of children: Not on file   Years of education: Not on file   Highest education level: Not on file  Occupational History   Not on file  Tobacco Use   Smoking status: Never   Smokeless tobacco: Never  Vaping Use   Vaping Use: Never used  Substance and Sexual Activity   Alcohol use: Not Currently    Comment: Glass of wine monthly   Drug use: Never   Sexual activity: Not Currently  Other Topics Concern   Not on file  Social History Narrative   Not on file   Social Determinants of Health   Financial Resource Strain: Not on file  Food Insecurity: Not on file  Transportation  Needs: Not on file  Physical Activity: Not on file  Stress: Not on file  Social Connections: Not on file        Objective:   Physical Exam Vitals reviewed.  Constitutional:      General: She is not in acute distress.    Appearance: She is well-developed. She is obese.  HENT:     Head: Normocephalic and atraumatic.     Right Ear: External ear normal.  Eyes:     Pupils: Pupils are equal, round, and reactive to light.  Neck:     Thyroid: No thyromegaly.  Cardiovascular:     Rate and Rhythm: Normal rate and regular rhythm.     Heart sounds: Normal heart sounds. No murmur heard. Pulmonary:      Effort: Pulmonary effort is normal. No respiratory distress.     Breath sounds: Normal breath sounds. No wheezing.  Abdominal:     General: Bowel sounds are normal. There is no distension.     Palpations: Abdomen is soft.     Tenderness: There is no abdominal tenderness.  Musculoskeletal:        General: No tenderness. Normal range of motion.     Cervical back: Normal range of motion and neck supple.     Right lower leg: Edema (3 +) present.     Left lower leg: Edema (2+) present.  Skin:    General: Skin is warm and dry.     Findings: Erythema and rash present.  Neurological:     Mental Status: She is alert and oriented to person, place, and time.     Cranial Nerves: No cranial nerve deficit.     Deep Tendon Reflexes: Reflexes are normal and symmetric.  Psychiatric:        Behavior: Behavior normal.        Thought Content: Thought content normal.        Judgment: Judgment normal.      BP 126/79   Pulse 61   Temp (!) 97.2 F (36.2 C) (Temporal)   Ht 5\' 7"  (1.702 m)   Wt 264 lb 3.2 oz (119.8 kg)   SpO2 100%   BMI 41.38 kg/m      Assessment & Plan:  Stacy Salazar comes in today with chief complaint of Insect Bite (/) and Abdominal Pain   Diagnosis and orders addressed:  1. Annual physical exam - Anemia Profile B  2. Other specified hypothyroidism  3. Rosacea  4. OSA (obstructive sleep apnea)   5. Iron deficiency anemia, unspecified iron deficiency anemia type - Anemia Profile B  6. Hx of gastric bypass  7. Morbid obesity (HCC)  8. Vitamin B 12 deficiency - Anemia Profile B  9. Vitamin D deficiency  10. Mixed hyperlipidemia   11. Insect bite of scalp, initial encounter Continue Allegra  - predniSONE (STERAPRED UNI-PAK 21 TAB) 10 MG (21) TBPK tablet; Use as directed  Dispense: 21 tablet; Refill: 0  12. Peripheral edema Keep elevate  - Compression stockings  13. Sprain and strain of right ankle    Labs pending Health Maintenance  reviewed Diet and exercise encouraged  Follow up plan: 1 year    Wayna Chalet, FNP

## 2022-07-26 LAB — ANEMIA PROFILE B
Basophils Absolute: 0.1 10*3/uL (ref 0.0–0.2)
Basos: 1 %
EOS (ABSOLUTE): 0.2 10*3/uL (ref 0.0–0.4)
Eos: 4 %
Ferritin: 187 ng/mL — ABNORMAL HIGH (ref 15–150)
Folate: >20 ng/mL (ref >3.0)
Hematocrit: 39.5 % (ref 34.0–46.6)
Hemoglobin: 13.7 g/dL (ref 11.1–15.9)
Immature Grans (Abs): 0 10*3/uL (ref 0.0–0.1)
Immature Granulocytes: 0 %
Iron Saturation: 29 % (ref 15–55)
Iron: 88 ug/dL (ref 27–139)
Lymphocytes Absolute: 2.5 10*3/uL (ref 0.7–3.1)
Lymphs: 43 %
MCH: 32.2 pg (ref 26.6–33.0)
MCHC: 34.7 g/dL (ref 31.5–35.7)
MCV: 93 fL (ref 79–97)
Monocytes Absolute: 0.4 10*3/uL (ref 0.1–0.9)
Monocytes: 8 %
Neutrophils Absolute: 2.6 10*3/uL (ref 1.4–7.0)
Neutrophils: 44 %
Platelets: 283 10*3/uL (ref 150–450)
RBC: 4.26 x10E6/uL (ref 3.77–5.28)
RDW: 12.4 % (ref 11.7–15.4)
Retic Ct Pct: 1.4 % (ref 0.6–2.6)
Total Iron Binding Capacity: 303 ug/dL (ref 250–450)
UIBC: 215 ug/dL (ref 118–369)
Vitamin B-12: 509 pg/mL (ref 232–1245)
WBC: 5.8 10*3/uL (ref 3.4–10.8)

## 2022-07-30 ENCOUNTER — Encounter (INDEPENDENT_AMBULATORY_CARE_PROVIDER_SITE_OTHER): Payer: Self-pay

## 2022-10-10 ENCOUNTER — Other Ambulatory Visit: Payer: Self-pay | Admitting: "Endocrinology

## 2022-10-13 ENCOUNTER — Encounter: Payer: Federal, State, Local not specified - PPO | Admitting: Family

## 2022-10-16 DIAGNOSIS — E039 Hypothyroidism, unspecified: Secondary | ICD-10-CM | POA: Diagnosis not present

## 2022-10-16 DIAGNOSIS — E782 Mixed hyperlipidemia: Secondary | ICD-10-CM | POA: Diagnosis not present

## 2022-10-17 LAB — TSH: TSH: 3.84 u[IU]/mL (ref 0.450–4.500)

## 2022-10-17 LAB — COMPREHENSIVE METABOLIC PANEL
ALT: 14 IU/L (ref 0–32)
AST: 13 IU/L (ref 0–40)
Albumin/Globulin Ratio: 2.4 — ABNORMAL HIGH (ref 1.2–2.2)
Albumin: 4.1 g/dL (ref 3.9–4.9)
Alkaline Phosphatase: 63 IU/L (ref 44–121)
BUN/Creatinine Ratio: 28 (ref 12–28)
BUN: 18 mg/dL (ref 8–27)
Bilirubin Total: 0.3 mg/dL (ref 0.0–1.2)
CO2: 24 mmol/L (ref 20–29)
Calcium: 9.2 mg/dL (ref 8.7–10.3)
Chloride: 106 mmol/L (ref 96–106)
Creatinine, Ser: 0.64 mg/dL (ref 0.57–1.00)
Globulin, Total: 1.7 g/dL (ref 1.5–4.5)
Glucose: 114 mg/dL — ABNORMAL HIGH (ref 70–99)
Potassium: 4.3 mmol/L (ref 3.5–5.2)
Sodium: 143 mmol/L (ref 134–144)
Total Protein: 5.8 g/dL — ABNORMAL LOW (ref 6.0–8.5)
eGFR: 100 mL/min/{1.73_m2} (ref >59)

## 2022-10-17 LAB — LIPID PANEL
Chol/HDL Ratio: 2.5 ratio (ref 0.0–4.4)
Cholesterol, Total: 170 mg/dL (ref 100–199)
HDL: 67 mg/dL (ref >39)
LDL Chol Calc (NIH): 80 mg/dL (ref 0–99)
Triglycerides: 134 mg/dL (ref 0–149)
VLDL Cholesterol Cal: 23 mg/dL (ref 5–40)

## 2022-10-17 LAB — T4, FREE: Free T4: 1.27 ng/dL (ref 0.82–1.77)

## 2022-10-20 ENCOUNTER — Ambulatory Visit: Payer: Federal, State, Local not specified - PPO | Admitting: "Endocrinology

## 2022-10-21 ENCOUNTER — Ambulatory Visit (INDEPENDENT_AMBULATORY_CARE_PROVIDER_SITE_OTHER): Payer: Federal, State, Local not specified - PPO

## 2022-10-21 DIAGNOSIS — Z23 Encounter for immunization: Secondary | ICD-10-CM | POA: Diagnosis not present

## 2022-10-23 ENCOUNTER — Ambulatory Visit: Payer: Federal, State, Local not specified - PPO | Admitting: "Endocrinology

## 2022-10-23 ENCOUNTER — Encounter: Payer: Self-pay | Admitting: "Endocrinology

## 2022-10-23 VITALS — BP 122/70 | HR 74 | Ht 66.25 in | Wt 246.0 lb

## 2022-10-23 DIAGNOSIS — E039 Hypothyroidism, unspecified: Secondary | ICD-10-CM

## 2022-10-23 DIAGNOSIS — E559 Vitamin D deficiency, unspecified: Secondary | ICD-10-CM | POA: Diagnosis not present

## 2022-10-23 DIAGNOSIS — E6609 Other obesity due to excess calories: Secondary | ICD-10-CM | POA: Diagnosis not present

## 2022-10-23 DIAGNOSIS — Z6839 Body mass index (BMI) 39.0-39.9, adult: Secondary | ICD-10-CM

## 2022-10-23 DIAGNOSIS — E782 Mixed hyperlipidemia: Secondary | ICD-10-CM

## 2022-10-23 DIAGNOSIS — R7303 Prediabetes: Secondary | ICD-10-CM

## 2022-10-23 MED ORDER — SYNTHROID 200 MCG PO TABS: 200.0000 ug | ORAL_TABLET | Freq: Every day | ORAL | 1 refills | Status: DC

## 2022-10-23 NOTE — Patient Instructions (Signed)

## 2022-10-23 NOTE — Progress Notes (Signed)
10/23/2022, 8:06 PM                Endocrinology follow-up note   Stacy Salazar is a 61 y.o.-year-old female patient being seen in follow-up for Hypothyroidism, weight management, vitamin D deficiency, hyperlipidemia. PCP: Sharion Balloon, FNP.   Past Medical History:  Diagnosis Date   Anemia    B12 deficiency    Bilateral swelling of feet    Gallbladder problem    Hypertension    Hypothyroidism    Joint pain    Morbid obesity (HCC)    RLS (restless legs syndrome)    Rosacea    Sleep apnea    SOB (shortness of breath)    Varicose veins of bilateral lower extremities with other complications    Vitamin D deficiency     Past Surgical History:  Procedure Laterality Date   CARPAL TUNNEL RELEASE Left    CESAREAN SECTION  1993   CHOLECYSTECTOMY  2016   ENDOVENOUS ABLATION SAPHENOUS VEIN W/ LASER Left 08/11/2019   endovenous laser ablation left gretaer saphenous vein and stab phlebectomy 10-20 incisions left leg by Gae Gallop MD    ENDOVENOUS ABLATION SAPHENOUS VEIN W/ LASER Right 08/25/2019   endovenous laser ablation right greater saphenous vein by Gae Gallop MD    GASTRIC BYPASS  12/23/2011   LASIK      Social History   Socioeconomic History   Marital status: Divorced    Spouse name: Not on file   Number of children: Not on file   Years of education: Not on file   Highest education level: Not on file  Occupational History   Not on file  Tobacco Use   Smoking status: Never   Smokeless tobacco: Never  Vaping Use   Vaping Use: Never used  Substance and Sexual Activity   Alcohol use: Not Currently    Comment: Glass of wine monthly   Drug use: Never   Sexual activity: Not Currently  Other Topics Concern   Not on file  Social History Narrative   Not on file   Social Determinants of Health   Financial Resource Strain: Not on file  Food Insecurity: Not on file  Transportation  Needs: Not on file  Physical Activity: Not on file  Stress: Not on file  Social Connections: Not on file    Family History  Problem Relation Age of Onset   Alcohol abuse Mother    Cancer Mother    Cirrhosis Mother    Alcoholism Mother    Stroke Father    Hypertension Father    Heart disease Father    Breast cancer Neg Hx     Outpatient Encounter Medications as of 10/23/2022  Medication Sig   CALCIUM CITRATE PO Take 500 mg by mouth 2 (two) times daily.   Cholecalciferol (VITAMIN D3 PO) Take 1 tablet by mouth daily in the afternoon.   Ferrous Sulfate (IRON PO) Take by mouth.   GLUCOSAMINE-CHONDROITIN PO Take 2 tablets by mouth daily.   metroNIDAZOLE (METROGEL) 0.75 % gel APPLY A THIN LAYER  OF GEL TOPICALLY TWICE DAILY   Multiple Vitamins-Minerals (MULTIVITAMIN ADULTS PO) Take by mouth.   pramipexole (MIRAPEX) 0.5 MG tablet Take 3 tablets (1.5 mg total) by mouth at bedtime.   SYNTHROID 200 MCG tablet Take 1 tablet (200 mcg total) by mouth daily before breakfast.   SYNTHROID 50 MCG tablet TAKE 1 TABLET BY MOUTH ONCE DAILY BEFORE BREAKFAST   TURMERIC CURCUMIN PO Take 1 tablet by mouth daily.   [DISCONTINUED] predniSONE (STERAPRED UNI-PAK 21 TAB) 10 MG (21) TBPK tablet Use as directed   [DISCONTINUED] SYNTHROID 200 MCG tablet Take 1 tablet (200 mcg total) by mouth daily before breakfast.   No facility-administered encounter medications on file as of 10/23/2022.    ALLERGIES: No Known Allergies  VACCINATION STATUS: Immunization History  Administered Date(s) Administered   Influenza,inj,Quad PF,6+ Mos 10/14/2012, 10/29/2018, 08/30/2019, 11/05/2020, 10/09/2021, 10/21/2022   PFIZER(Purple Top)SARS-COV-2 Vaccination 03/15/2020, 04/07/2020, 11/02/2020, 09/20/2021   Tdap 05/02/2019   Zoster Recombinat (Shingrix) 01/06/2020, 03/19/2020   Zoster, Live 06/26/2013     HPI    Stacy Salazar  is a patient with the above medical history. she was diagnosed  with hypothyroidism at  approximate age of 30 years. She was subsequently initiated on levothyroxine replacement.  she was given various doses of levothyroxine over the years.  During her last visit, her levothyroxine was adjusted at 250 mcg p.o. daily before breakfast.  She continues to feel better.  More importantly, she has made some significant changes in her lifestyle after discussing lifestyle medicine.  She has lost a total of 42 pounds.  She feels better, has no new complaints today.   She has sleep apnea, using her CPAP less frequently than before.  She reports compliance to her medication.      Pt denies feeling nodules in neck, hoarseness, dysphagia/odynophagia, SOB with lying down.  she denies family history of  thyroid disorders, no family history of thyroid malignancy.  No history of  radiation therapy to head or neck, no history of thyroid surgery.  I reviewed her chart and she also has a history of obesity, status post gastric bypass in 2013.  She also has sleep apnea, restless leg syndrome.   ROS: Limited as above.   Physical Exam: BP 122/70   Pulse 74   Ht 5' 6.25" (1.683 m)   Wt 246 lb (111.6 kg)   BMI 39.41 kg/m  Wt Readings from Last 3 Encounters:  10/23/22 246 lb (111.6 kg)  07/25/22 264 lb 3.2 oz (119.8 kg)  07/18/22 259 lb 6.4 oz (117.7 kg)      CMP ( most recent) CMP     Component Value Date/Time   NA 143 10/16/2022 0809   K 4.3 10/16/2022 0809   CL 106 10/16/2022 0809   CO2 24 10/16/2022 0809   GLUCOSE 114 (H) 10/16/2022 0809   BUN 18 10/16/2022 0809   CREATININE 0.64 10/16/2022 0809   CALCIUM 9.2 10/16/2022 0809   PROT 5.8 (L) 10/16/2022 0809   ALBUMIN 4.1 10/16/2022 0809   AST 13 10/16/2022 0809   ALT 14 10/16/2022 0809   ALKPHOS 63 10/16/2022 0809   BILITOT 0.3 10/16/2022 0809   GFRNONAA 102 07/12/2020 0947   GFRAA 117 07/12/2020 0947     Lipid Panel     Component Value Date/Time   CHOL 170 10/16/2022 0809   TRIG 134 10/16/2022 0809   HDL 67  10/16/2022 0809   CHOLHDL 2.5 10/16/2022 0809   LDLCALC 80 10/16/2022 0809   LABVLDL  23 10/16/2022 0809       Lab Results  Component Value Date   TSH 3.840 10/16/2022   TSH 0.827 07/14/2022   TSH 6.980 (H) 01/09/2022   TSH 4.210 06/27/2021   TSH 3.940 05/13/2021   TSH 12.44 (H) 11/05/2020   TSH 3.97 05/02/2020   TSH 1.50 01/02/2020   TSH 12.300 (H) 10/20/2019   TSH 0.685 05/02/2019   FREET4 1.27 10/16/2022   FREET4 1.49 07/14/2022   FREET4 1.08 01/09/2022   FREET4 1.37 05/13/2021   FREET4 1.1 11/05/2020   FREET4 1.3 05/02/2020   FREET4 1.6 01/02/2020       ASSESSMENT: 1. Hypothyroidism 2.  Vitamin D deficiency 3.  Obesity 4.  Dyslipidemia 5.  Prediabetes PLAN:    Patient with long-standing hypothyroidism.  Her previsit thyroid function tests are consistent with appropriate replacement.  She is advised to continue levothyroxine 250 mcg p.o. daily before breakfast.    - We discussed about the correct intake of her thyroid hormone, on empty stomach at fasting, with water, separated by at least 30 minutes from breakfast and other medications,  and separated by more than 4 hours from calcium, iron, multivitamins, acid reflux medications (PPIs). -Patient is made aware of the fact that thyroid hormone replacement is needed for life, dose to be adjusted by periodic monitoring of thyroid function tests.  -Due to absence of clinical goiter, no need for thyroid ultrasound.  Given her metabolic syndrome indicated by obesity, dyslipidemia, dysglycemia, sleep apnea, she was given brief lifestyle medicine package during her last visit, presents with continued significant improvement in her profile and was 42 pounds of weight loss.    - she acknowledges that there is a room for improvement in her food and drink choices. - Suggestion is made for her to avoid simple carbohydrates  from her diet including Cakes, Sweet Desserts, Ice Cream, Soda (diet and regular), Sweet Tea, Candies,  Chips, Cookies, Store Bought Juices, Alcohol , Artificial Sweeteners,  Coffee Creamer, and "Sugar-free" Products, Lemonade. This will help patient to have more stable blood glucose profile and potentially avoid unintended weight gain.  The following Lifestyle Medicine recommendations according to American College of Lifestyle Medicine  [facility]) were discussed and and offered to patient and she  agrees to start the journey:  A. Whole Foods, Plant-Based Nutrition comprising of fruits and vegetables, plant-based proteins, whole-grain carbohydrates was discussed in detail with the patient.   A list for source of those nutrients were also provided to the patient.  Patient will use only water or unsweetened tea for hydration. B.  The need to stay away from risky substances including alcohol, smoking; obtaining 7 to 9 hours of restorative sleep, at least 150 minutes of moderate intensity exercise weekly, the importance of healthy social connections,  and stress management techniques were discussed. C.  A full color page of  Calorie density of various food groups per pound showing examples of each food groups was provided to the patient.    -Her 25-hydroxy vitamin D is corrected at 42.  She is advised to continue vitamin D3 5000 units daily.   Her recent point-of-care A1c was 5.1%, improving from 5.7%, reversing prediabetes.     She is advised to maintain close follow-up with her PCP Jannifer Rodney, FNP.   I spent 35 minutes in the care of the patient today including review of labs from Thyroid Function, CMP, and other relevant labs ; imaging/biopsy records (current and previous including abstractions from other facilities); face-to-face  time discussing  her lab results and symptoms, medications doses, her options of short and long term treatment based on the latest standards of care / guidelines;   and documenting the encounter. Risk reduction counseling performed per USPSTF guidelines to reduce obesity and  cardiovascular risk factors.    Wayna Chalet  participated in the discussions, expressed understanding, and voiced agreement with the above plans.  All questions were answered to her satisfaction. she is encouraged to contact clinic should she have any questions or concerns prior to her return visit.    Return in about 6 months (around 04/23/2023) for Fasting Labs  in AM B4 8.  Marquis Lunch, MD [facility] [address] [place], [place] [address] Phone: [phone]  Fax: [phone]   10/23/2022, 8:06 PM  This note was partially dictated with voice recognition software. Similar sounding words can be transcribed inadequately or may not  be corrected upon review.

## 2022-10-24 DIAGNOSIS — E6609 Other obesity due to excess calories: Secondary | ICD-10-CM | POA: Insufficient documentation

## 2022-12-30 ENCOUNTER — Ambulatory Visit: Payer: Federal, State, Local not specified - PPO | Admitting: Nurse Practitioner

## 2022-12-30 ENCOUNTER — Encounter: Payer: Self-pay | Admitting: Nurse Practitioner

## 2022-12-30 VITALS — BP 142/85 | HR 61 | Temp 97.7°F | Resp 20 | Ht 66.0 in | Wt 262.0 lb

## 2022-12-30 DIAGNOSIS — J0100 Acute maxillary sinusitis, unspecified: Secondary | ICD-10-CM | POA: Diagnosis not present

## 2022-12-30 MED ORDER — AMOXICILLIN-POT CLAVULANATE 875-125 MG PO TABS: 1.0000 | ORAL_TABLET | Freq: Two times a day (BID) | ORAL | 0 refills | Status: DC

## 2022-12-30 NOTE — Patient Instructions (Signed)
1. Take meds as prescribed 2. Use a cool mist humidifier especially during the winter months and when heat has been humid. 3. Use saline nose sprays frequently 4. Saline irrigations of the nose can be very helpful if done frequently.  * 4X daily for 1 week*  * Use of a nettie pot can be helpful with this. Follow directions with this* 5. Drink plenty of fluids 6. Keep thermostat turn down low 7.For any cough or congestion- robitussin DM 8. For fever or aces or pains- take tylenol or ibuprofen appropriate for age and weight.  * for fevers greater than 101 orally you may alternate ibuprofen and tylenol every  3 hours.    

## 2022-12-30 NOTE — Progress Notes (Signed)
Subjective:    Patient ID: Stacy Salazar, female    DOB: [birth year], 63 y.o.   MRN: [id number]   Chief Complaint: Sinus Problem (Started 2 weeks ago)   Sinus Problem This is a new problem. The current episode started 1 to 4 weeks ago. The problem has been waxing and waning since onset. The maximum temperature recorded prior to her arrival was 100.4 - 100.9 F. The fever has been present for 3 to 4 days. Her pain is at a severity of 7/10. The pain is moderate. Associated symptoms include congestion, coughing, headaches and sinus pressure. Pertinent negatives include no chills, ear pain or shortness of breath. Past treatments include acetaminophen. The treatment provided mild relief.       Review of Systems  Constitutional:  Negative for chills and fever (no fever now).  HENT:  Positive for congestion and sinus pressure. Negative for ear pain.   Respiratory:  Positive for cough. Negative for shortness of breath.   Neurological:  Positive for headaches.       Objective:   Physical Exam Vitals reviewed.  HENT:     Left Ear: Tympanic membrane normal.     Nose: Congestion and rhinorrhea present.     Right Sinus: Maxillary sinus tenderness present. No frontal sinus tenderness.     Left Sinus: Maxillary sinus tenderness present. No frontal sinus tenderness.     Mouth/Throat:     Pharynx: No oropharyngeal exudate or posterior oropharyngeal erythema.  Cardiovascular:     Rate and Rhythm: Normal rate and regular rhythm.     Heart sounds: Normal heart sounds.  Pulmonary:     Effort: Pulmonary effort is normal.     Breath sounds: Normal breath sounds.  Skin:    General: Skin is warm.  Neurological:     General: No focal deficit present.     Mental Status: She is alert and oriented to person, place, and time.  Psychiatric:        Mood and Affect: Mood normal.        Behavior: Behavior normal.     BP (!) 142/85   Pulse 61   Temp 97.7 F (36.5 C) (Temporal)   Resp 20   Ht 5'  6" (1.676 m)   Wt 262 lb (118.8 kg)   SpO2 100%   BMI 42.29 kg/m        Assessment & Plan:  Stacy Salazar in today with chief complaint of Sinus Problem (Started 2 weeks ago)   1. Acute non-recurrent maxillary sinusitis 1. Take meds as prescribed 2. Use a cool mist humidifier especially during the winter months and when heat has been humid. 3. Use saline nose sprays frequently 4. Saline irrigations of the nose can be very helpful if done frequently.  * 4X daily for 1 week*  * Use of a nettie pot can be helpful with this. Follow directions with this* 5. Drink plenty of fluids 6. Keep thermostat turn down low 7.For any cough or congestion- decongestant OTC 8. For fever or aces or pains- take tylenol or ibuprofen appropriate for age and weight.  * for fevers greater than 101 orally you may alternate ibuprofen and tylenol every  3 hours.    Meds ordered this encounter  Medications   amoxicillin-clavulanate (AUGMENTIN) 875-125 MG tablet    Sig: Take 1 tablet by mouth 2 (two) times daily.    Dispense:  14 tablet    Refill:  0    Order Specific Question:  Supervising Provider    Answer:   Worthy Rancher [[id number]]      The above assessment and management plan was discussed with the patient. The patient verbalized understanding of and has agreed to the management plan. Patient is aware to call the clinic if symptoms persist or worsen. Patient is aware when to return to the clinic for a follow-up visit. Patient educated on when it is appropriate to go to the emergency department.   Mary-Margaret Hassell Done, FNP

## 2023-01-21 ENCOUNTER — Telehealth: Payer: Self-pay | Admitting: Family

## 2023-01-21 DIAGNOSIS — Z1211 Encounter for screening for malignant neoplasm of colon: Secondary | ICD-10-CM

## 2023-01-21 NOTE — Telephone Encounter (Signed)
REFERRAL REQUEST Telephone Note  Have you been seen at our office for this problem? yes (Advise that they may need an appointment with their PCP before a referral can be done)  Reason for Referral: colonoscopy Referral discussed with patient: yes  Best contact number of patient for referral team: [phone]    Has patient been seen by a specialist for this issue before: yes 11 years ago  Patient provider preference for referral: n/a Patient location preference for referral: [place]   Patient notified that referrals can take up to a week or longer to process. If they haven't heard anything within a week they should call back and speak with the referral department.

## 2023-01-26 ENCOUNTER — Encounter: Payer: Self-pay | Admitting: *Deleted

## 2023-01-26 NOTE — Telephone Encounter (Signed)
Referral ordered

## 2023-02-09 ENCOUNTER — Telehealth (INDEPENDENT_AMBULATORY_CARE_PROVIDER_SITE_OTHER): Payer: Self-pay | Admitting: *Deleted

## 2023-02-09 NOTE — Telephone Encounter (Signed)
  Procedure: Colonoscopy  Estimated body mass index is 42.29 kg/m as calculated from the following:   Height as of 12/30/22: 5' 6"$  (1.676 m).   Weight as of 12/30/22: 262 lb (118.8 kg).   Have you had a colonoscopy before?  05/2012  Do you have family history of colon cancer?  no  Do you have a family history of polyps? no  Previous colonoscopy with polyps removed? no  Do you have a history colorectal cancer?   no  Are you diabetic?  no  Do you have a prosthetic or mechanical heart valve? no  Do you have a pacemaker/defibrillator?   no  Have you had endocarditis/atrial fibrillation?  no  Do you use supplemental oxygen/CPAP?  yes cpap  Have you had joint replacement within the last 12 months?  no  Do you tend to be constipated or have to use laxatives?  no   Do you have history of alcohol use? If yes, how much and how often.  no  Do you have history or are you using drugs? If yes, what do are you  using?  no  Have you ever had a stroke/heart attack?  no  Have you ever had a heart or other vascular stent placed,?no  Do you take weight loss medication? no  female patients,: have you had a hysterectomy? no                              are you post menopausal?  yes                              do you still have your menstrual cycle? no    Date of last menstrual period?   Do you take any blood-thinning medications such as: (Plavix, aspirin, Coumadin, Aggrenox, Brilinta, Xarelto, Eliquis, Pradaxa, Savaysa or Effient)? no  If yes we need the name, milligram, dosage and who is prescribing doctor:               Current Outpatient Medications  Medication Sig Dispense Refill   CALCIUM CITRATE PO Take 500 mg by mouth 2 (two) times daily.     Cholecalciferol (VITAMIN D3 PO) Take 1 tablet by mouth daily in the afternoon.     Ferrous Sulfate (IRON PO) Take by mouth.     GLUCOSAMINE-CHONDROITIN PO Take 2 tablets by mouth daily.     metroNIDAZOLE (METROGEL) 0.75 % gel APPLY A THIN  LAYER OF GEL TOPICALLY TWICE DAILY 45 g 5   Multiple Vitamins-Minerals (MULTIVITAMIN ADULTS PO) Take by mouth.     pramipexole (MIRAPEX) 0.5 MG tablet Take 3 tablets (1.5 mg total) by mouth at bedtime. 270 tablet 2   SYNTHROID 200 MCG tablet Take 1 tablet (200 mcg total) by mouth daily before breakfast. 90 tablet 1   SYNTHROID 50 MCG tablet TAKE 1 TABLET BY MOUTH ONCE DAILY BEFORE BREAKFAST 90 tablet 0   TURMERIC CURCUMIN PO Take 1 tablet by mouth daily.     No current facility-administered medications for this visit.    No Known Allergies

## 2023-02-10 MED ORDER — PEG 3350-KCL-NA BICARB-NACL 420 G PO SOLR: 4000.0000 mL | Freq: Once | ORAL | 0 refills | Status: AC

## 2023-02-10 NOTE — Telephone Encounter (Signed)
Scheduled with pt for 3/4 at 1:15pm. Will send instructions to her with pre-op appt, rx for prep sent to pharmacy

## 2023-02-10 NOTE — Telephone Encounter (Signed)
ASA 3. Hold iron 7 days prior.

## 2023-02-10 NOTE — Addendum Note (Signed)
Addended by: Cheron Every on: 02/10/2023 04:19 PM   Modules accepted: Orders

## 2023-02-11 ENCOUNTER — Encounter: Payer: Self-pay | Admitting: *Deleted

## 2023-02-12 NOTE — Telephone Encounter (Signed)
Referral completed

## 2023-02-13 ENCOUNTER — Other Ambulatory Visit: Payer: Self-pay | Admitting: "Endocrinology

## 2023-02-19 NOTE — Patient Instructions (Signed)
Stacy Salazar  02/19/2023     '@PREFPERIOPPHARMACY'$ @   Your procedure is scheduled on  02/23/2023.   Report to [facility] at  1115  A.M.   Call this number if you have problems the morning of surgery:  [phone]  If you experience any cold or flu symptoms such as cough, fever, chills, shortness of breath, etc. between now and your scheduled surgery, please notify us at the above number.   Remember:  Follow the diet and prep instructions given to you by the office.     Take these medicines the morning of surgery with A SIP OF WATER                                     synthroid.     Do not wear jewelry, make-up or nail polish.  Do not wear lotions, powders, or perfumes, or deodorant.  Do not shave 48 hours prior to surgery.  Men may shave face and neck.  Do not bring valuables to the hospital.  [facility] is not responsible for any belongings or valuables.  Contacts, dentures or bridgework may not be worn into surgery.  Leave your suitcase in the car.  After surgery it may be brought to your room.  For patients admitted to the hospital, discharge time will be determined by your treatment team.  Patients discharged the day of surgery will not be allowed to drive home and must have someone with them for 24 hours.    Special instructions:   DO NOT smoke tobacco or vape for 24 hours before your procedure.  Please read over the following fact sheets that you were given. Anesthesia Post-op Instructions and Care and Recovery After Surgery      Colonoscopy, Adult, Care After The following information offers guidance on how to care for yourself after your procedure. Your health care provider may also give you more specific instructions. If you have problems or questions, contact your health care provider. What can I expect after the procedure? After the procedure, it is common to have: A small amount of blood in your stool for 24 hours after the procedure. Some  gas. Mild cramping or bloating of your abdomen. Follow these instructions at home: Eating and drinking  Drink enough fluid to keep your urine pale yellow. Follow instructions from your health care provider about eating or drinking restrictions. Resume your normal diet as told by your health care provider. Avoid heavy or fried foods that are hard to digest. Activity Rest as told by your health care provider. Avoid sitting for a long time without moving. Get up to take short walks every 1-2 hours. This is important to improve blood flow and breathing. Ask for help if you feel weak or unsteady. Return to your normal activities as told by your health care provider. Ask your health care provider what activities are safe for you. Managing cramping and bloating  Try walking around when you have cramps or feel bloated. If directed, apply heat to your abdomen as told by your health care provider. Use the heat source that your health care provider recommends, such as a moist heat pack or a heating pad. Place a towel between your skin and the heat source. Leave the heat on for 20-30 minutes. Remove the heat if your skin turns bright red. This is especially important if you are unable to  feel pain, heat, or cold. You have a greater risk of getting burned. General instructions If you were given a sedative during the procedure, it can affect you for several hours. Do not drive or operate machinery until your health care provider says that it is safe. For the first 24 hours after the procedure: Do not sign important documents. Do not drink alcohol. Do your regular daily activities at a slower pace than normal. Eat soft foods that are easy to digest. Take over-the-counter and prescription medicines only as told by your health care provider. Keep all follow-up visits. This is important. Contact a health care provider if: You have blood in your stool 2-3 days after the procedure. Get help right away  if: You have more than a small spotting of blood in your stool. You have large blood clots in your stool. You have swelling of your abdomen. You have nausea or vomiting. You have a fever. You have increasing pain in your abdomen that is not relieved with medicine. These symptoms may be an emergency. Get help right away. Call 911. Do not wait to see if the symptoms will go away. Do not drive yourself to the hospital. Summary After the procedure, it is common to have a small amount of blood in your stool. You may also have mild cramping and bloating of your abdomen. If you were given a sedative during the procedure, it can affect you for several hours. Do not drive or operate machinery until your health care provider says that it is safe. Get help right away if you have a lot of blood in your stool, nausea or vomiting, a fever, or increased pain in your abdomen. This information is not intended to replace advice given to you by your health care provider. Make sure you discuss any questions you have with your health care provider. Document Revised: 07/31/2021 Document Reviewed: 07/31/2021 Elsevier Patient Education  [place] After The following information offers guidance on how to care for yourself after your procedure. Your health care provider may also give you more specific instructions. If you have problems or questions, contact your health care provider. What can I expect after the procedure? After the procedure, it is common to have: Tiredness. Little or no memory about what happened during or after the procedure. Impaired judgment when it comes to making decisions. Nausea or vomiting. Some trouble with balance. Follow these instructions at home: For the time period you were told by your health care provider:  Rest. Do not participate in activities where you could fall or become injured. Do not drive or use machinery. Do not drink  alcohol. Do not take sleeping pills or medicines that cause drowsiness. Do not make important decisions or sign legal documents. Do not take care of children on your own. Medicines Take over-the-counter and prescription medicines only as told by your health care provider. If you were prescribed antibiotics, take them as told by your health care provider. Do not stop using the antibiotic even if you start to feel better. Eating and drinking Follow instructions from your health care provider about what you may eat and drink. Drink enough fluid to keep your urine pale yellow. If you vomit: Drink clear fluids slowly and in small amounts as you are able. Clear fluids include water, ice chips, low-calorie sports drinks, and fruit juice that has water added to it (diluted fruit juice). Eat light and bland foods in small amounts as you are able.  These foods include bananas, applesauce, rice, lean meats, toast, and crackers. General instructions  Have a responsible adult stay with you for the time you are told. It is important to have someone help care for you until you are awake and alert. If you have sleep apnea, surgery and some medicines can increase your risk for breathing problems. Follow instructions from your health care provider about wearing your sleep device: When you are sleeping. This includes during daytime naps. While taking prescription pain medicines, sleeping medicines, or medicines that make you drowsy. Do not use any products that contain nicotine or tobacco. These products include cigarettes, chewing tobacco, and vaping devices, such as e-cigarettes. If you need help quitting, ask your health care provider. Contact a health care provider if: You feel nauseous or vomit every time you eat or drink. You feel light-headed. You are still sleepy or having trouble with balance after 24 hours. You get a rash. You have a fever. You have redness or swelling around the IV site. Get help  right away if: You have trouble breathing. You have new confusion after you get home. These symptoms may be an emergency. Get help right away. Call 911. Do not wait to see if the symptoms will go away. Do not drive yourself to the hospital. This information is not intended to replace advice given to you by your health care provider. Make sure you discuss any questions you have with your health care provider. Document Revised: 05/05/2022 Document Reviewed: 05/05/2022 Elsevier Patient Education  [place].

## 2023-02-20 ENCOUNTER — Encounter (HOSPITAL_COMMUNITY): Payer: Self-pay

## 2023-02-20 ENCOUNTER — Encounter (HOSPITAL_COMMUNITY)
Admission: RE | Admit: 2023-02-20 | Discharge: 2023-02-20 | Disposition: A | Discharge status: Home / Self Care | Payer: Federal, State, Local not specified - PPO | Source: Ambulatory Visit | Attending: Internal Medicine | Admitting: Internal Medicine

## 2023-02-20 VITALS — BP 113/77 | HR 58 | Temp 98.0°F | Resp 18

## 2023-02-20 DIAGNOSIS — D509 Iron deficiency anemia, unspecified: Secondary | ICD-10-CM | POA: Insufficient documentation

## 2023-02-20 DIAGNOSIS — R7303 Prediabetes: Secondary | ICD-10-CM | POA: Insufficient documentation

## 2023-02-20 DIAGNOSIS — Z01818 Encounter for other preprocedural examination: Secondary | ICD-10-CM | POA: Insufficient documentation

## 2023-02-20 DIAGNOSIS — I10 Essential (primary) hypertension: Secondary | ICD-10-CM | POA: Diagnosis not present

## 2023-02-20 DIAGNOSIS — Z1211 Encounter for screening for malignant neoplasm of colon: Secondary | ICD-10-CM | POA: Diagnosis not present

## 2023-02-20 DIAGNOSIS — K648 Other hemorrhoids: Secondary | ICD-10-CM | POA: Diagnosis not present

## 2023-02-20 DIAGNOSIS — G4730 Sleep apnea, unspecified: Secondary | ICD-10-CM | POA: Diagnosis not present

## 2023-02-20 DIAGNOSIS — R001 Bradycardia, unspecified: Secondary | ICD-10-CM | POA: Insufficient documentation

## 2023-02-20 LAB — CBC WITH DIFFERENTIAL/PLATELET
Abs Immature Granulocytes: 0.02 10*3/uL (ref 0.00–0.07)
Basophils Absolute: 0.1 10*3/uL (ref 0.0–0.1)
Basophils Relative: 1 %
Eosinophils Absolute: 0.2 10*3/uL (ref 0.0–0.5)
Eosinophils Relative: 3 %
HCT: 37.6 % (ref 36.0–46.0)
Hemoglobin: 12.3 g/dL (ref 12.0–15.0)
Immature Granulocytes: 0 %
Lymphocytes Relative: 39 %
Lymphs Abs: 2.5 10*3/uL (ref 0.7–4.0)
MCH: 31.1 pg (ref 26.0–34.0)
MCHC: 32.7 g/dL (ref 30.0–36.0)
MCV: 94.9 fL (ref 80.0–100.0)
Monocytes Absolute: 0.5 10*3/uL (ref 0.1–1.0)
Monocytes Relative: 8 %
Neutro Abs: 3.1 10*3/uL (ref 1.7–7.7)
Neutrophils Relative %: 49 %
Platelets: 319 10*3/uL (ref 150–400)
RBC: 3.96 MIL/uL (ref 3.87–5.11)
RDW: 12.1 % (ref 11.5–15.5)
WBC: 6.4 10*3/uL (ref 4.0–10.5)
nRBC: 0 % (ref 0.0–0.2)

## 2023-02-20 LAB — BASIC METABOLIC PANEL
Anion gap: 6 (ref 5–15)
BUN: 19 mg/dL (ref 8–23)
CO2: 25 mmol/L (ref 22–32)
Calcium: 8.7 mg/dL — ABNORMAL LOW (ref 8.9–10.3)
Chloride: 103 mmol/L (ref 98–111)
Creatinine, Ser: 0.67 mg/dL (ref 0.44–1.00)
GFR, Estimated: >60 mL/min (ref >60)
Glucose, Bld: 87 mg/dL (ref 70–99)
Potassium: 3.7 mmol/L (ref 3.5–5.1)
Sodium: 134 mmol/L — ABNORMAL LOW (ref 135–145)

## 2023-02-23 ENCOUNTER — Ambulatory Visit (HOSPITAL_COMMUNITY): Payer: Federal, State, Local not specified - PPO | Admitting: Certified Registered Nurse Anesthetist

## 2023-02-23 ENCOUNTER — Encounter (HOSPITAL_COMMUNITY): Payer: Self-pay

## 2023-02-23 ENCOUNTER — Ambulatory Visit (HOSPITAL_COMMUNITY)
Admission: RE | Admit: 2023-02-23 | Discharge: 2023-02-23 | Disposition: A | Discharge status: Home / Self Care | Payer: Federal, State, Local not specified - PPO | Attending: Internal Medicine | Admitting: Internal Medicine

## 2023-02-23 ENCOUNTER — Encounter (HOSPITAL_COMMUNITY)
Admission: RE | Disposition: A | Discharge status: Home / Self Care | Payer: Self-pay | Source: Home / Self Care | Attending: Internal Medicine

## 2023-02-23 DIAGNOSIS — R001 Bradycardia, unspecified: Secondary | ICD-10-CM | POA: Insufficient documentation

## 2023-02-23 DIAGNOSIS — Z1211 Encounter for screening for malignant neoplasm of colon: Secondary | ICD-10-CM | POA: Insufficient documentation

## 2023-02-23 DIAGNOSIS — G4730 Sleep apnea, unspecified: Secondary | ICD-10-CM | POA: Diagnosis not present

## 2023-02-23 DIAGNOSIS — I10 Essential (primary) hypertension: Secondary | ICD-10-CM | POA: Diagnosis not present

## 2023-02-23 DIAGNOSIS — R7303 Prediabetes: Secondary | ICD-10-CM | POA: Insufficient documentation

## 2023-02-23 DIAGNOSIS — D509 Iron deficiency anemia, unspecified: Secondary | ICD-10-CM | POA: Diagnosis not present

## 2023-02-23 DIAGNOSIS — K648 Other hemorrhoids: Secondary | ICD-10-CM | POA: Insufficient documentation

## 2023-02-23 HISTORY — PX: COLONOSCOPY WITH PROPOFOL: SHX5780

## 2023-02-23 SURGERY — COLONOSCOPY WITH PROPOFOL
Anesthesia: General

## 2023-02-23 MED ORDER — LIDOCAINE HCL (CARDIAC) PF 100 MG/5ML IV SOSY
PREFILLED_SYRINGE | INTRAVENOUS | Status: DC | PRN
  Administered 2023-02-23: 50 mg via INTRATRACHEAL

## 2023-02-23 MED ORDER — STERILE WATER FOR IRRIGATION IR SOLN
Status: DC | PRN
  Administered 2023-02-23: 50 mL

## 2023-02-23 MED ORDER — PROPOFOL 500 MG/50ML IV EMUL
INTRAVENOUS | Status: DC | PRN
  Administered 2023-02-23: 200 ug/kg/min via INTRAVENOUS

## 2023-02-23 MED ORDER — PROPOFOL 10 MG/ML IV BOLUS
INTRAVENOUS | Status: DC | PRN
  Administered 2023-02-23: 100 mg via INTRAVENOUS

## 2023-02-23 MED ORDER — LACTATED RINGERS IV SOLN: INTRAVENOUS | Status: DC

## 2023-02-23 NOTE — Anesthesia Preprocedure Evaluation (Signed)
Anesthesia Evaluation  Patient identified by MRN, date of birth, ID band Patient awake    Reviewed: Allergy & Precautions, H&P , NPO status , Patient's Chart, lab work & pertinent test results, reviewed documented beta blocker date and time   Airway Mallampati: II  TM Distance: >3 FB Neck ROM: full    Dental no notable dental hx.    Pulmonary neg pulmonary ROS, sleep apnea    Pulmonary exam normal breath sounds clear to auscultation       Cardiovascular Exercise Tolerance: Good hypertension, negative cardio ROS  Rhythm:regular Rate:Normal     Neuro/Psych negative neurological ROS  negative psych ROS   GI/Hepatic negative GI ROS, Neg liver ROS,,,  Endo/Other  negative endocrine ROSHypothyroidism    Renal/GU negative Renal ROS  negative genitourinary   Musculoskeletal   Abdominal   Peds  Hematology negative hematology ROS (+) Blood dyscrasia, anemia   Anesthesia Other Findings   Reproductive/Obstetrics negative OB ROS                             Anesthesia Physical Anesthesia Plan  ASA: 2  Anesthesia Plan: General   Post-op Pain Management:    Induction:   PONV Risk Score and Plan: Propofol infusion  Airway Management Planned:   Additional Equipment:   Intra-op Plan:   Post-operative Plan:   Informed Consent: I have reviewed the patients History and Physical, chart, labs and discussed the procedure including the risks, benefits and alternatives for the proposed anesthesia with the patient or authorized representative who has indicated his/her understanding and acceptance.     Dental Advisory Given  Plan Discussed with: CRNA  Anesthesia Plan Comments:        Anesthesia Quick Evaluation

## 2023-02-23 NOTE — Discharge Instructions (Signed)
  Colonoscopy Discharge Instructions  Read the instructions outlined below and refer to this sheet in the next few weeks. These discharge instructions provide you with general information on caring for yourself after you leave the hospital. Your doctor may also give you specific instructions. While your treatment has been planned according to the most current medical practices available, unavoidable complications occasionally occur.   ACTIVITY You may resume your regular activity, but move at a slower pace for the next 24 hours.  Take frequent rest periods for the next 24 hours.  Walking will help get rid of the air and reduce the bloated feeling in your belly (abdomen).  No driving for 24 hours (because of the medicine (anesthesia) used during the test).   Do not sign any important legal documents or operate any machinery for 24 hours (because of the anesthesia used during the test).  NUTRITION Drink plenty of fluids.  You may resume your normal diet as instructed by your doctor.  Begin with a light meal and progress to your normal diet. Heavy or fried foods are harder to digest and may make you feel sick to your stomach (nauseated).  Avoid alcoholic beverages for 24 hours or as instructed.  MEDICATIONS You may resume your normal medications unless your doctor tells you otherwise.  WHAT YOU CAN EXPECT TODAY Some feelings of bloating in the abdomen.  Passage of more gas than usual.  Spotting of blood in your stool or on the toilet paper.  IF YOU HAD POLYPS REMOVED DURING THE COLONOSCOPY: No aspirin products for 7 days or as instructed.  No alcohol for 7 days or as instructed.  Eat a soft diet for the next 24 hours.  FINDING OUT THE RESULTS OF YOUR TEST Not all test results are available during your visit. If your test results are not back during the visit, make an appointment with your caregiver to find out the results. Do not assume everything is normal if you have not heard from your  caregiver or the medical facility. It is important for you to follow up on all of your test results.  SEEK IMMEDIATE MEDICAL ATTENTION IF: You have more than a spotting of blood in your stool.  Your belly is swollen (abdominal distention).  You are nauseated or vomiting.  You have a temperature over 101.  You have abdominal pain or discomfort that is severe or gets worse throughout the day.   Your colonoscopy was relatively unremarkable.  I did not find any polyps or evidence of colon cancer.  I recommend repeating colonoscopy in 10 years for colon cancer screening purposes.  Follow-up with GI as needed.   I hope you have a great rest of your week!  Allina Riches K. Sonora Catlin, D.O. Gastroenterology and Hepatology [facility]  

## 2023-02-23 NOTE — Op Note (Signed)
[facility] Patient Name: Stacy Salazar Procedure Date: 02/23/2023 11:20 AM MRN: [id number] Date of Birth: 09-21-1961 Attending MD: Elon Alas. Abbey Chatters , [place], [id number] CSN: [id number] Age: 62 Admit Type: Outpatient Procedure:                Colonoscopy Indications:              Screening for colorectal malignant neoplasm Providers:                Elon Alas. Abbey Chatters, DO, Charlsie Quest. Theda Sers RN, RN,                            Randa Spike, Technician Referring MD:              Medicines:                See the Anesthesia note for documentation of the                            administered medications Complications:            No immediate complications. Estimated Blood Loss:     Estimated blood loss: none. Procedure:                Pre-Anesthesia Assessment:                           - The anesthesia plan was to use monitored                            anesthesia care (MAC).                           After obtaining informed consent, the colonoscope                            was passed under direct vision. Throughout the                            procedure, the patient's blood pressure, pulse, and                            oxygen saturations were monitored continuously. The                            [id number] scope was introduced through                            the anus and advanced to the the terminal ileum,                            with identification of the appendiceal orifice and                            IC valve. The colonoscopy was performed without                            difficulty. The patient  tolerated the procedure                            well. The quality of the bowel preparation was                            evaluated using the BBPS [facility] Bowel Preparation                            Scale) with scores of: Right Colon = 3, Transverse                            Colon = 3 and Left Colon = 3 (entire mucosa seen                            well  with no residual staining, small fragments of                            stool or opaque liquid). The total BBPS score                            equals 9. Scope In: 11:26:40 AM Scope Out: 11:42:59 AM Scope Withdrawal Time: 0 hours 13 minutes 59 seconds  Total Procedure Duration: 0 hours 16 minutes 19 seconds  Findings:      The perianal and digital rectal examinations were normal.      Non-bleeding internal hemorrhoids were found during endoscopy.      The terminal ileum appeared normal.      The exam was otherwise without abnormality. Impression:               - Non-bleeding internal hemorrhoids.                           - The examined portion of the ileum was normal.                           - The examination was otherwise normal.                           - No specimens collected. Moderate Sedation:      Per Anesthesia Care Recommendation:           - Patient has a contact number available for                            emergencies. The signs and symptoms of potential                            delayed complications were discussed with the                            patient. Return to normal activities tomorrow.                            Written discharge instructions were provided to the  patient.                           - Resume previous diet.                           - Continue present medications.                           - Repeat colonoscopy in 10 years for screening                            purposes.                           - Return to GI clinic PRN. Procedure Code(s):        --- Professional ---                           [id number], Colorectal cancer screening; colonoscopy on                            individual not meeting criteria for high risk Diagnosis Code(s):        --- Professional ---                           Z12.11, Encounter for screening for malignant                            neoplasm of colon                            K64.8, Other hemorrhoids CPT copyright 2022 American Medical Association. All rights reserved. The codes documented in this report are preliminary and upon coder review may  be revised to meet current compliance requirements. Elon Alas. Abbey Chatters, DO [place] Abbey Chatters, DO 02/23/2023 11:48:04 AM This report has been signed electronically. Number of Addenda: 0

## 2023-02-23 NOTE — Transfer of Care (Signed)
Immediate Anesthesia Transfer of Care Note  Patient: Stacy Salazar  Procedure(s) Performed: COLONOSCOPY WITH PROPOFOL  Patient Location: Short Stay  Anesthesia Type:General  Level of Consciousness: awake, alert , oriented, and patient cooperative  Airway & Oxygen Therapy: Patient Spontanous Breathing  Post-op Assessment: Report given to RN, Post -op Vital signs reviewed and stable, and Patient moving all extremities  Post vital signs: Reviewed and stable  Last Vitals:  Vitals Value Taken Time  BP    Temp    Pulse    Resp    SpO2      Last Pain:  Vitals:   02/23/23 1123  TempSrc:   PainSc: 0-No pain         Complications: No notable events documented.

## 2023-02-23 NOTE — H&P (Signed)
Primary Care Physician:  Sharion Balloon, FNP Primary Gastroenterologist:  Dr. Abbey Chatters  Pre-Procedure History & Physical: HPI:  Stacy Salazar is a 62 y.o. female is here for a colonoscopy for colon cancer screening purposes.  Patient denies any family history of colorectal cancer.  No melena or hematochezia.  No abdominal pain or unintentional weight loss.  No change in bowel habits.  Overall feels well from a GI standpoint.  Past Medical History:  Diagnosis Date   Anemia    B12 deficiency    Bilateral swelling of feet    Gallbladder problem    Hypertension    Hypothyroidism    Joint pain    Morbid obesity (HCC)    RLS (restless legs syndrome)    Rosacea    Sleep apnea    SOB (shortness of breath)    Varicose veins of bilateral lower extremities with other complications    Vitamin D deficiency     Past Surgical History:  Procedure Laterality Date   CARPAL TUNNEL RELEASE Left    CESAREAN SECTION  1993   CHOLECYSTECTOMY  2016   ENDOVENOUS ABLATION SAPHENOUS VEIN W/ LASER Left 08/11/2019   endovenous laser ablation left [place] saphenous vein and stab phlebectomy 10-20 incisions left leg by Gae Gallop MD    ENDOVENOUS [place] W/ LASER Right 08/25/2019   endovenous laser ablation right greater saphenous vein by Gae Gallop MD    GASTRIC BYPASS  12/23/2011   LASIK      Prior to Admission medications   Medication Sig Start Date End Date Taking? Authorizing Provider  CALCIUM CITRATE PO Take 500 mg by mouth 2 (two) times daily.   Yes [provider]  Cholecalciferol (VITAMIN D3 PO) Take 1 tablet by mouth daily in the afternoon.   Yes [provider]  GLUCOSAMINE-CHONDROITIN PO Take 2 tablets by mouth daily.   Yes [provider]  metroNIDAZOLE (METROGEL) 0.75 % gel APPLY A THIN LAYER OF GEL TOPICALLY TWICE DAILY Patient taking differently: Apply 1 Application topically 2 (two) times daily as needed (rosacea). 10/09/21  Yes Hawks,  Christy A, FNP  Multiple Vitamins-Minerals (MULTIVITAMIN ADULTS PO) Take by mouth.   Yes [provider]  pramipexole (MIRAPEX) 0.5 MG tablet Take 3 tablets (1.5 mg total) by mouth at bedtime. Patient taking differently: Take 1.5 mg by mouth at bedtime as needed (RLS). 03/05/22  Yes Hawks, Christy A, FNP  SYNTHROID 200 MCG tablet Take 1 tablet (200 mcg total) by mouth daily before breakfast. 10/23/22  Yes Nida, Marella Chimes, MD  SYNTHROID 50 MCG tablet TAKE 1 TABLET BY MOUTH ONCE DAILY BEFORE BREAKFAST 02/13/23  Yes Nida, Marella Chimes, MD  TURMERIC CURCUMIN PO Take 1 tablet by mouth daily.   Yes [provider]    Allergies as of 02/10/2023   (No Known Allergies)    Family History  Problem Relation Age of Onset   Alcohol abuse Mother    Cancer Mother    Cirrhosis Mother    Alcoholism Mother    Stroke Father    Hypertension Father    Heart disease Father    Breast cancer Neg Hx     Social History   Socioeconomic History   Marital status: Divorced    Spouse name: Not on file   Number of children: Not on file   Years of education: Not on file   Highest education level: Not on file  Occupational History   Not on file  Tobacco Use  Smoking status: Never   Smokeless tobacco: Never  Vaping Use   Vaping Use: Never used  Substance and Sexual Activity   Alcohol use: Not Currently    Comment: Glass of wine monthly   Drug use: Never   Sexual activity: Not Currently  Other Topics Concern   Not on file  Social History Narrative   Not on file   Social Determinants of Health   Financial Resource Strain: Not on file  Food Insecurity: Not on file  Transportation Needs: Not on file  Physical Activity: Not on file  Stress: Not on file  Social Connections: Not on file  Intimate Partner Violence: Not on file    Review of Systems: See HPI, otherwise negative ROS  Physical Exam: Vital signs in last 24 hours: Temp:  [98.9 F (37.2 C)] 98.9 F (37.2 C)  (03/04 1056) Pulse Rate:  [61] 61 (03/04 1056) Resp:  [18] 18 (03/04 1056) BP: (119)/(87) 119/87 (03/04 1056) SpO2:  [95 %] 95 % (03/04 1056) Weight:  [115.7 kg] 115.7 kg (03/04 1056)   General:   Alert,  Well-developed, well-nourished, pleasant and cooperative in NAD Head:  Normocephalic and atraumatic. Eyes:  Sclera clear, no icterus.   Conjunctiva pink. Ears:  Normal auditory acuity. Nose:  No deformity, discharge,  or lesions. Msk:  Symmetrical without gross deformities. Normal posture. Extremities:  Without clubbing or edema. Neurologic:  Alert and  oriented x4;  grossly normal neurologically. Skin:  Intact without significant lesions or rashes. Psych:  Alert and cooperative. Normal mood and affect.  Impression/Plan: Stacy Salazar is here for a colonoscopy to be performed for colon cancer screening purposes.  The risks of the procedure including infection, bleed, or perforation as well as benefits, limitations, alternatives and imponderables have been reviewed with the patient. Questions have been answered. All parties agreeable.

## 2023-02-25 NOTE — Anesthesia Postprocedure Evaluation (Signed)
Anesthesia Post Note  Patient: Stacy Salazar  Procedure(s) Performed: COLONOSCOPY WITH PROPOFOL  Patient location during evaluation: Phase II Anesthesia Type: General Level of consciousness: awake Pain management: pain level controlled Vital Signs Assessment: post-procedure vital signs reviewed and stable Respiratory status: spontaneous breathing and respiratory function stable Cardiovascular status: blood pressure returned to baseline and stable Postop Assessment: no headache and no apparent nausea or vomiting Anesthetic complications: no Comments: Late entry   No notable events documented.   Last Vitals:  Vitals:   02/23/23 1056 02/23/23 1147  BP: 119/87 118/63  Pulse: 61 (!) 54  Resp: 18 16  Temp: 37.2 C 36.5 C  SpO2: 95% 96%    Last Pain:  Vitals:   02/24/23 1238  TempSrc:   PainSc: 0-No pain                 Louann Sjogren

## 2023-03-06 ENCOUNTER — Encounter (HOSPITAL_COMMUNITY): Payer: Self-pay | Admitting: Internal Medicine

## 2023-04-16 DIAGNOSIS — E039 Hypothyroidism, unspecified: Secondary | ICD-10-CM | POA: Diagnosis not present

## 2023-04-16 DIAGNOSIS — E782 Mixed hyperlipidemia: Secondary | ICD-10-CM | POA: Diagnosis not present

## 2023-04-16 DIAGNOSIS — E559 Vitamin D deficiency, unspecified: Secondary | ICD-10-CM | POA: Diagnosis not present

## 2023-04-17 LAB — COMPREHENSIVE METABOLIC PANEL
ALT: 18 IU/L (ref 0–32)
AST: 18 IU/L (ref 0–40)
Albumin/Globulin Ratio: 1.9 (ref 1.2–2.2)
Albumin: 4.1 g/dL (ref 3.9–4.9)
Alkaline Phosphatase: 73 IU/L (ref 44–121)
BUN/Creatinine Ratio: 21 (ref 12–28)
BUN: 12 mg/dL (ref 8–27)
Bilirubin Total: 0.3 mg/dL (ref 0.0–1.2)
CO2: 25 mmol/L (ref 20–29)
Calcium: 9.3 mg/dL (ref 8.7–10.3)
Chloride: 104 mmol/L (ref 96–106)
Creatinine, Ser: 0.56 mg/dL — ABNORMAL LOW (ref 0.57–1.00)
Globulin, Total: 2.2 g/dL (ref 1.5–4.5)
Glucose: 98 mg/dL (ref 70–99)
Potassium: 4.5 mmol/L (ref 3.5–5.2)
Sodium: 142 mmol/L (ref 134–144)
Total Protein: 6.3 g/dL (ref 6.0–8.5)
eGFR: 103 mL/min/{1.73_m2} (ref >59)

## 2023-04-17 LAB — VITAMIN B12: Vitamin B-12: 495 pg/mL (ref 232–1245)

## 2023-04-17 LAB — LIPID PANEL
Chol/HDL Ratio: 3 ratio (ref 0.0–4.4)
Cholesterol, Total: 184 mg/dL (ref 100–199)
HDL: 62 mg/dL (ref >39)
LDL Chol Calc (NIH): 92 mg/dL (ref 0–99)
Triglycerides: 175 mg/dL — ABNORMAL HIGH (ref 0–149)
VLDL Cholesterol Cal: 30 mg/dL (ref 5–40)

## 2023-04-17 LAB — T4, FREE: Free T4: 1.12 ng/dL (ref 0.82–1.77)

## 2023-04-17 LAB — VITAMIN D 25 HYDROXY (VIT D DEFICIENCY, FRACTURES): Vit D, 25-Hydroxy: 34.4 ng/mL (ref 30.0–100.0)

## 2023-04-17 LAB — TSH: TSH: 9.79 u[IU]/mL — ABNORMAL HIGH (ref 0.450–4.500)

## 2023-04-23 ENCOUNTER — Ambulatory Visit: Payer: Federal, State, Local not specified - PPO | Admitting: "Endocrinology

## 2023-06-17 ENCOUNTER — Other Ambulatory Visit: Payer: Self-pay | Admitting: "Endocrinology

## 2023-06-19 ENCOUNTER — Other Ambulatory Visit: Payer: Self-pay | Admitting: Family

## 2023-06-19 DIAGNOSIS — Z1231 Encounter for screening mammogram for malignant neoplasm of breast: Secondary | ICD-10-CM

## 2023-06-24 ENCOUNTER — Ambulatory Visit
Admission: RE | Admit: 2023-06-24 | Discharge: 2023-06-24 | Disposition: A | Discharge status: Home / Self Care | Payer: Federal, State, Local not specified - PPO | Source: Ambulatory Visit | Attending: Family | Admitting: Family

## 2023-06-24 DIAGNOSIS — Z1231 Encounter for screening mammogram for malignant neoplasm of breast: Secondary | ICD-10-CM | POA: Diagnosis not present

## 2023-07-27 ENCOUNTER — Other Ambulatory Visit (HOSPITAL_COMMUNITY)
Admission: RE | Admit: 2023-07-27 | Discharge: 2023-07-27 | Disposition: A | Discharge status: Home / Self Care | Payer: Federal, State, Local not specified - PPO | Source: Ambulatory Visit | Attending: Family | Admitting: Family

## 2023-07-27 ENCOUNTER — Encounter: Payer: Self-pay | Admitting: Family

## 2023-07-27 ENCOUNTER — Ambulatory Visit (INDEPENDENT_AMBULATORY_CARE_PROVIDER_SITE_OTHER): Payer: Federal, State, Local not specified - PPO | Admitting: Family

## 2023-07-27 VITALS — BP 123/67 | HR 55 | Temp 98.2°F | Ht 67.0 in | Wt 281.8 lb

## 2023-07-27 DIAGNOSIS — Z01411 Encounter for gynecological examination (general) (routine) with abnormal findings: Secondary | ICD-10-CM

## 2023-07-27 DIAGNOSIS — Z01419 Encounter for gynecological examination (general) (routine) without abnormal findings: Secondary | ICD-10-CM | POA: Diagnosis not present

## 2023-07-27 DIAGNOSIS — E559 Vitamin D deficiency, unspecified: Secondary | ICD-10-CM

## 2023-07-27 DIAGNOSIS — E782 Mixed hyperlipidemia: Secondary | ICD-10-CM

## 2023-07-27 DIAGNOSIS — E038 Other specified hypothyroidism: Secondary | ICD-10-CM

## 2023-07-27 DIAGNOSIS — E538 Deficiency of other specified B group vitamins: Secondary | ICD-10-CM

## 2023-07-27 DIAGNOSIS — Z9884 Bariatric surgery status: Secondary | ICD-10-CM

## 2023-07-27 DIAGNOSIS — Z6841 Body Mass Index (BMI) 40.0 and over, adult: Secondary | ICD-10-CM

## 2023-07-27 DIAGNOSIS — D509 Iron deficiency anemia, unspecified: Secondary | ICD-10-CM

## 2023-07-27 DIAGNOSIS — G4733 Obstructive sleep apnea (adult) (pediatric): Secondary | ICD-10-CM

## 2023-07-27 DIAGNOSIS — L719 Rosacea, unspecified: Secondary | ICD-10-CM

## 2023-07-27 DIAGNOSIS — Z0000 Encounter for general adult medical examination without abnormal findings: Secondary | ICD-10-CM

## 2023-07-27 LAB — CBC WITH DIFFERENTIAL/PLATELET
Basophils Absolute: 0.1 10*3/uL (ref 0.0–0.2)
Basos: 1 %
EOS (ABSOLUTE): 0.2 10*3/uL (ref 0.0–0.4)
Eos: 3 %
Hematocrit: 37.1 % (ref 34.0–46.6)
Hemoglobin: 12.4 g/dL (ref 11.1–15.9)
Immature Grans (Abs): 0 10*3/uL (ref 0.0–0.1)
Immature Granulocytes: 0 %
Lymphocytes Absolute: 2.2 10*3/uL (ref 0.7–3.1)
Lymphs: 37 %
MCH: 31.2 pg (ref 26.6–33.0)
MCHC: 33.4 g/dL (ref 31.5–35.7)
MCV: 94 fL (ref 79–97)
Monocytes Absolute: 0.4 10*3/uL (ref 0.1–0.9)
Monocytes: 8 %
Neutrophils Absolute: 3 10*3/uL (ref 1.4–7.0)
Neutrophils: 51 %
Platelets: 262 10*3/uL (ref 150–450)
RBC: 3.97 x10E6/uL (ref 3.77–5.28)
RDW: 12.1 % (ref 11.7–15.4)
WBC: 5.8 10*3/uL (ref 3.4–10.8)

## 2023-07-27 LAB — CMP14+EGFR
ALT: 16 IU/L (ref 0–32)
AST: 19 IU/L (ref 0–40)
Albumin: 4 g/dL (ref 3.9–4.9)
Alkaline Phosphatase: 71 IU/L (ref 44–121)
BUN/Creatinine Ratio: 23 (ref 12–28)
BUN: 12 mg/dL (ref 8–27)
Bilirubin Total: 0.3 mg/dL (ref 0.0–1.2)
CO2: 24 mmol/L (ref 20–29)
Calcium: 9.1 mg/dL (ref 8.7–10.3)
Chloride: 106 mmol/L (ref 96–106)
Creatinine, Ser: 0.52 mg/dL — ABNORMAL LOW (ref 0.57–1.00)
Globulin, Total: 1.9 g/dL (ref 1.5–4.5)
Glucose: 91 mg/dL (ref 70–99)
Potassium: 4.6 mmol/L (ref 3.5–5.2)
Sodium: 142 mmol/L (ref 134–144)
Total Protein: 5.9 g/dL — ABNORMAL LOW (ref 6.0–8.5)
eGFR: 105 mL/min/{1.73_m2} (ref >59)

## 2023-07-27 LAB — LIPID PANEL
Chol/HDL Ratio: 3.3 ratio (ref 0.0–4.4)
Cholesterol, Total: 195 mg/dL (ref 100–199)
HDL: 59 mg/dL (ref >39)
LDL Chol Calc (NIH): 100 mg/dL — ABNORMAL HIGH (ref 0–99)
Triglycerides: 214 mg/dL — ABNORMAL HIGH (ref 0–149)
VLDL Cholesterol Cal: 36 mg/dL (ref 5–40)

## 2023-07-27 LAB — VITAMIN D 25 HYDROXY (VIT D DEFICIENCY, FRACTURES)

## 2023-07-27 NOTE — Progress Notes (Signed)
Subjective:    Patient ID: Stacy Salazar, female    DOB: 1961-04-25, 62 y.o.   MRN: [id number]  Chief Complaint  Patient presents with   Annual Exam    With PAP   Pt presents to the office today for CPE without pap.  She has hx of gastric bypass.    She is followed by Endocrinologists every 6 months. She has rosacea and uses metrogel as needed.    Has OSA and uses CPAP nightly. Stable. She is morbid obese with a BMI of 44.   Thyroid Problem Presents for follow-up visit. Symptoms include fatigue. Patient reports no anxiety, diaphoresis or hoarse voice. The symptoms have been stable. Her past medical history is significant for hyperlipidemia.  Anemia Presents for follow-up visit. Symptoms include malaise/fatigue.  Hyperlipidemia This is a chronic problem. The current episode started more than 1 year ago. Exacerbating diseases include obesity. Current antihyperlipidemic treatment includes diet change. The current treatment provides no improvement of lipids. Risk factors for coronary artery disease include dyslipidemia, a sedentary lifestyle and post-menopausal.      Review of Systems  Constitutional:  Positive for fatigue and malaise/fatigue. Negative for diaphoresis.  HENT:  Negative for hoarse voice.   Psychiatric/Behavioral:  The patient is not nervous/anxious.   All other systems reviewed and are negative.  Family History  Problem Relation Age of Onset   Alcohol abuse Mother    Cancer Mother    Cirrhosis Mother    Alcoholism Mother    Stroke Father    Hypertension Father    Heart disease Father    Breast cancer Neg Hx    Social History   Socioeconomic History   Marital status: Divorced    Spouse name: Not on file   Number of children: Not on file   Years of education: Not on file   Highest education level: Not on file  Occupational History   Not on file  Tobacco Use   Smoking status: Never   Smokeless tobacco: Never  Vaping Use   Vaping status: Never Used   Substance and Sexual Activity   Alcohol use: Not Currently    Comment: Glass of wine monthly   Drug use: Never   Sexual activity: Not Currently  Other Topics Concern   Not on file  Social History Narrative   Not on file   Social Determinants of Health   Financial Resource Strain: Not on file  Food Insecurity: Not on file  Transportation Needs: Not on file  Physical Activity: Not on file  Stress: Not on file  Social Connections: Unknown (06/09/2023)   Received from [employer]    How often do you feel lonely or isolated from those around you? (Adult - for ages 72 years and over): Not on file       Objective:   Physical Exam Vitals reviewed.  Constitutional:      General: She is not in acute distress.    Appearance: She is well-developed. She is obese.  HENT:     Head: Normocephalic and atraumatic.     Right Ear: Tympanic membrane normal.     Left Ear: Tympanic membrane normal.  Eyes:     Pupils: Pupils are equal, round, and reactive to light.  Neck:     Thyroid: No thyromegaly.  Cardiovascular:     Rate and Rhythm: Normal rate and regular rhythm.     Heart sounds: Normal heart sounds. No murmur heard. Pulmonary:  Effort: Pulmonary effort is normal. No respiratory distress.     Breath sounds: Normal breath sounds. No wheezing.  Abdominal:     General: Bowel sounds are normal. There is no distension.     Palpations: Abdomen is soft.     Tenderness: There is no abdominal tenderness.  Genitourinary:    General: Normal vulva.     Comments: Bimanual exam- no adnexal masses or tenderness, ovaries nonpalpable   Cervix parous and pink- No discharge  Musculoskeletal:        General: No tenderness. Normal range of motion.     Cervical back: Normal range of motion and neck supple.  Skin:    General: Skin is warm and dry.  Neurological:     Mental Status: She is alert and oriented to person, place, and time.     Cranial Nerves: No cranial nerve  deficit.     Deep Tendon Reflexes: Reflexes are normal and symmetric.  Psychiatric:        Behavior: Behavior normal.        Thought Content: Thought content normal.        Judgment: Judgment normal.       BP 123/67   Pulse (!) 55   Temp 98.2 F (36.8 C) (Temporal)   Ht 5\' 7"  (1.702 m)   Wt 281 lb 12.8 oz (127.8 kg)   SpO2 98%   BMI 44.14 kg/m      Assessment & Plan:  Stacy Salazar comes in today with chief complaint of Annual Exam (With PAP)   Diagnosis and orders addressed:  1. Annual physical exam - CBC with Differential/Platelet - CMP14+EGFR - Lipid panel - VITAMIN D 25 Hydroxy (Vit-D Deficiency, Fractures) - Cytology - PAP([place])  2. Encounter for gynecological examination without abnormal finding - CBC with Differential/Platelet - CMP14+EGFR - Cytology - PAP([place])  3. Vitamin D deficiency - CBC with Differential/Platelet - CMP14+EGFR - VITAMIN D 25 Hydroxy (Vit-D Deficiency, Fractures)  4. Vitamin B 12 deficiency - CBC with Differential/Platelet - CMP14+EGFR  5. Rosacea - CBC with Differential/Platelet - CMP14+EGFR  6. Morbid obesity (HCC) - CBC with Differential/Platelet - CMP14+EGFR  7. Mixed hyperlipidemia - CBC with Differential/Platelet - CMP14+EGFR - Lipid panel  8. Hx of gastric bypass - CBC with Differential/Platelet - CMP14+EGFR  9. Other specified hypothyroidism - CBC with Differential/Platelet - CMP14+EGFR  10. Iron deficiency anemia, unspecified iron deficiency anemia type - CBC with Differential/Platelet - CMP14+EGFR  11. OSA (obstructive sleep apnea) - CBC with Differential/Platelet - CMP14+EGFR   Labs pending Health Maintenance reviewed Diet and exercise encouraged  Follow up plan:  1 year   Jannifer Rodney, FNP

## 2023-07-27 NOTE — Patient Instructions (Signed)

## 2023-07-28 ENCOUNTER — Other Ambulatory Visit: Payer: Self-pay | Admitting: Family

## 2023-07-28 MED ORDER — VITAMIN D (ERGOCALCIFEROL) 1.25 MG (50000 UNIT) PO CAPS: 50000.0000 [IU] | ORAL_CAPSULE | ORAL | 3 refills | Status: DC

## 2023-07-31 ENCOUNTER — Other Ambulatory Visit: Payer: Self-pay | Admitting: Family

## 2023-08-03 ENCOUNTER — Other Ambulatory Visit: Payer: Self-pay | Admitting: Family

## 2023-08-03 DIAGNOSIS — L719 Rosacea, unspecified: Secondary | ICD-10-CM

## 2023-08-04 ENCOUNTER — Telehealth: Payer: Self-pay | Admitting: Family

## 2023-08-04 NOTE — Telephone Encounter (Signed)
Paper work has been filled out and sent. Conformation recived from [employer] will leave copy up front for patient to pick up as well. Called to tell patient no answer and could not leave message.

## 2023-08-04 NOTE — Telephone Encounter (Signed)
Pt called to check on status of the paperwork that she dropped off the other day to see if it had been filled out and faxed in? Paperwork is medical form for [employer].

## 2023-08-07 NOTE — Telephone Encounter (Signed)
Pt called to let nurse know that she does not need to pick up a hard copy of the paperwork because [employer] called and confirmed with her that they received the paperwork.

## 2023-09-08 ENCOUNTER — Encounter: Payer: Self-pay | Admitting: Family

## 2023-09-08 ENCOUNTER — Ambulatory Visit: Payer: Federal, State, Local not specified - PPO | Admitting: Family

## 2023-09-08 VITALS — BP 127/84 | HR 60 | Temp 97.7°F | Ht 67.0 in | Wt 285.4 lb

## 2023-09-08 DIAGNOSIS — E038 Other specified hypothyroidism: Secondary | ICD-10-CM | POA: Diagnosis not present

## 2023-09-08 DIAGNOSIS — Z713 Dietary counseling and surveillance: Secondary | ICD-10-CM

## 2023-09-08 DIAGNOSIS — Z23 Encounter for immunization: Secondary | ICD-10-CM | POA: Diagnosis not present

## 2023-09-08 DIAGNOSIS — Z6841 Body Mass Index (BMI) 40.0 and over, adult: Secondary | ICD-10-CM

## 2023-09-08 MED ORDER — PHENTERMINE HCL 37.5 MG PO TABS: 37.5000 mg | ORAL_TABLET | Freq: Every day | ORAL | 2 refills | Status: DC

## 2023-09-08 NOTE — Patient Instructions (Signed)

## 2023-09-08 NOTE — Progress Notes (Signed)
Subjective:    Patient ID: Stacy Salazar, female    DOB: [birth year], 62 y.o.   MRN: [id number]  Chief Complaint  Patient presents with   Weight Check    Discuss wt loss meds    Pt presents to the office today to discuss weight loss. She reports she has gained 40 lbs over the last 6 months. She has tried dieting and exercise without relief.      09/08/2023    1:46 PM 07/27/2023    9:28 AM 02/23/2023   10:56 AM  Last 3 Weights  Weight (lbs) 285 lb 6.4 oz 281 lb 12.8 oz 255 lb  Weight (kg) 129.457 kg 127.824 kg 115.667 kg     Thyroid Problem Presents for follow-up visit. Patient reports no anxiety, depressed mood, dry skin or fatigue. The symptoms have been stable.      Review of Systems  Constitutional:  Negative for fatigue.  Psychiatric/Behavioral:  The patient is not nervous/anxious.   All other systems reviewed and are negative.      Objective:   Physical Exam Vitals reviewed.  Constitutional:      General: She is not in acute distress.    Appearance: She is well-developed. She is obese.  HENT:     Head: Normocephalic and atraumatic.     Right Ear: Tympanic membrane normal.     Left Ear: Tympanic membrane normal.  Eyes:     Pupils: Pupils are equal, round, and reactive to light.  Neck:     Thyroid: No thyromegaly.  Cardiovascular:     Rate and Rhythm: Normal rate and regular rhythm.     Heart sounds: Normal heart sounds. No murmur heard. Pulmonary:     Effort: Pulmonary effort is normal. No respiratory distress.     Breath sounds: Normal breath sounds. No wheezing.  Abdominal:     General: Bowel sounds are normal. There is no distension.     Palpations: Abdomen is soft.     Tenderness: There is no abdominal tenderness.  Musculoskeletal:        General: No tenderness. Normal range of motion.     Cervical back: Normal range of motion and neck supple.  Skin:    General: Skin is warm and dry.  Neurological:     Mental Status: She is alert and oriented to  person, place, and time.     Cranial Nerves: No cranial nerve deficit.     Deep Tendon Reflexes: Reflexes are normal and symmetric.  Psychiatric:        Behavior: Behavior normal.        Thought Content: Thought content normal.        Judgment: Judgment normal.          BP 127/84   Pulse 60   Temp 97.7 F (36.5 C) (Temporal)   Ht 5\' 7"  (1.702 m)   Wt 285 lb 6.4 oz (129.5 kg)   SpO2 95%   BMI 44.70 kg/m   Assessment & Plan:  Stacy Salazar comes in today with chief complaint of Weight Check (Discuss wt loss meds )   Diagnosis and orders addressed:  1. Encounter for immunization - Flu vaccine trivalent PF, 6mos and older(Flulaval,Afluria,Fluarix,Fluzone)  2. Other specified hypothyroidism  3. Weight loss counseling, encounter for - phentermine (ADIPEX-P) 37.5 MG tablet; Take 1 tablet (37.5 mg total) by mouth daily before breakfast.  Dispense: 30 tablet; Refill: 2  4. Morbid obesity (HCC) - phentermine (ADIPEX-P) 37.5 MG tablet; Take 1 tablet (  37.5 mg total) by mouth daily before breakfast.  Dispense: 30 tablet; Refill: 2   Start phentermine  Avoid caffeine  Can take 1/2 tab for a few days to see how she adjusts Force fluids Possible Adverse effects discussed  Labs reviewed  Health Maintenance reviewed Diet and exercise encouraged  Follow up plan: 3 months    Jannifer Rodney, FNP

## 2023-12-03 ENCOUNTER — Other Ambulatory Visit: Payer: Self-pay | Admitting: "Endocrinology

## 2023-12-04 ENCOUNTER — Other Ambulatory Visit: Payer: Self-pay | Admitting: "Endocrinology

## 2023-12-07 ENCOUNTER — Other Ambulatory Visit: Payer: Self-pay

## 2023-12-07 DIAGNOSIS — E039 Hypothyroidism, unspecified: Secondary | ICD-10-CM

## 2023-12-07 MED ORDER — SYNTHROID 200 MCG PO TABS: 200.0000 ug | ORAL_TABLET | Freq: Every day | ORAL | 0 refills | Status: DC

## 2023-12-07 MED ORDER — SYNTHROID 50 MCG PO TABS: 50.0000 ug | ORAL_TABLET | Freq: Every day | ORAL | 0 refills | Status: DC

## 2023-12-08 ENCOUNTER — Ambulatory Visit: Payer: Federal, State, Local not specified - PPO | Admitting: Family

## 2024-01-27 ENCOUNTER — Telehealth: Payer: Self-pay | Admitting: "Endocrinology

## 2024-01-27 DIAGNOSIS — E538 Deficiency of other specified B group vitamins: Secondary | ICD-10-CM

## 2024-01-27 DIAGNOSIS — E782 Mixed hyperlipidemia: Secondary | ICD-10-CM

## 2024-01-27 DIAGNOSIS — E559 Vitamin D deficiency, unspecified: Secondary | ICD-10-CM

## 2024-01-27 DIAGNOSIS — E039 Hypothyroidism, unspecified: Secondary | ICD-10-CM

## 2024-01-27 DIAGNOSIS — R7303 Prediabetes: Secondary | ICD-10-CM

## 2024-01-27 NOTE — Telephone Encounter (Signed)
 Labs updated

## 2024-01-27 NOTE — Telephone Encounter (Signed)
 Pt needs labs updated

## 2024-03-09 DIAGNOSIS — E559 Vitamin D deficiency, unspecified: Secondary | ICD-10-CM | POA: Diagnosis not present

## 2024-03-09 DIAGNOSIS — E782 Mixed hyperlipidemia: Secondary | ICD-10-CM | POA: Diagnosis not present

## 2024-03-09 DIAGNOSIS — E039 Hypothyroidism, unspecified: Secondary | ICD-10-CM | POA: Diagnosis not present

## 2024-03-09 DIAGNOSIS — R7303 Prediabetes: Secondary | ICD-10-CM | POA: Diagnosis not present

## 2024-03-10 LAB — LIPID PANEL
Chol/HDL Ratio: 3.2 ratio (ref 0.0–4.4)
Cholesterol, Total: 189 mg/dL (ref 100–199)
HDL: 60 mg/dL (ref >39)
LDL Chol Calc (NIH): 104 mg/dL — ABNORMAL HIGH (ref 0–99)
Triglycerides: 143 mg/dL (ref 0–149)
VLDL Cholesterol Cal: 25 mg/dL (ref 5–40)

## 2024-03-10 LAB — T4, FREE: Free T4: 1.14 ng/dL (ref 0.82–1.77)

## 2024-03-10 LAB — COMPREHENSIVE METABOLIC PANEL
ALT: 16 IU/L (ref 0–32)
AST: 18 IU/L (ref 0–40)
Albumin: 4.2 g/dL (ref 3.9–4.9)
Alkaline Phosphatase: 82 IU/L (ref 44–121)
BUN/Creatinine Ratio: 25 (ref 12–28)
BUN: 14 mg/dL (ref 8–27)
Bilirubin Total: 0.2 mg/dL (ref 0.0–1.2)
CO2: 25 mmol/L (ref 20–29)
Calcium: 9.4 mg/dL (ref 8.7–10.3)
Chloride: 104 mmol/L (ref 96–106)
Creatinine, Ser: 0.57 mg/dL (ref 0.57–1.00)
Globulin, Total: 2.2 g/dL (ref 1.5–4.5)
Glucose: 97 mg/dL (ref 70–99)
Potassium: 4.6 mmol/L (ref 3.5–5.2)
Sodium: 142 mmol/L (ref 134–144)
Total Protein: 6.4 g/dL (ref 6.0–8.5)
eGFR: 102 mL/min/{1.73_m2} (ref >59)

## 2024-03-10 LAB — VITAMIN B12: Vitamin B-12: 697 pg/mL (ref 232–1245)

## 2024-03-10 LAB — TSH: TSH: 3.7 u[IU]/mL (ref 0.450–4.500)

## 2024-03-10 LAB — VITAMIN D 25 HYDROXY (VIT D DEFICIENCY, FRACTURES): Vit D, 25-Hydroxy: 36.3 ng/mL (ref 30.0–100.0)

## 2024-03-11 ENCOUNTER — Ambulatory Visit: Payer: Federal, State, Local not specified - PPO | Admitting: "Endocrinology

## 2024-03-14 DIAGNOSIS — K080 Exfoliation of teeth due to systemic causes: Secondary | ICD-10-CM | POA: Diagnosis not present

## 2024-03-16 NOTE — Telephone Encounter (Signed)
 CPE 07/28/2024

## 2024-03-21 ENCOUNTER — Ambulatory Visit: Payer: Federal, State, Local not specified - PPO | Admitting: "Endocrinology

## 2024-03-21 ENCOUNTER — Encounter: Payer: Self-pay | Admitting: "Endocrinology

## 2024-03-21 VITALS — BP 132/88 | HR 72 | Ht 67.0 in | Wt 283.4 lb

## 2024-03-21 DIAGNOSIS — E039 Hypothyroidism, unspecified: Secondary | ICD-10-CM

## 2024-03-21 DIAGNOSIS — E559 Vitamin D deficiency, unspecified: Secondary | ICD-10-CM | POA: Diagnosis not present

## 2024-03-21 DIAGNOSIS — R7303 Prediabetes: Secondary | ICD-10-CM

## 2024-03-21 MED ORDER — SYNTHROID 50 MCG PO TABS: 50.0000 ug | ORAL_TABLET | Freq: Every day | ORAL | 1 refills | Status: DC

## 2024-03-21 MED ORDER — SYNTHROID 200 MCG PO TABS: 200.0000 ug | ORAL_TABLET | Freq: Every day | ORAL | 1 refills | Status: DC

## 2024-03-21 NOTE — Progress Notes (Signed)
 03/21/2024, 6:08 PM                Endocrinology follow-up note   Stacy Salazar is a 63 y.o.-year-old female patient being seen in follow-up for Hypothyroidism, weight management, vitamin D deficiency, hyperlipidemia. PCP: Junie Spencer, FNP.   Past Medical History:  Diagnosis Date   Anemia    B12 deficiency    Bilateral swelling of feet    Gallbladder problem    Hypertension    Hypothyroidism    Joint pain    Morbid obesity (HCC)    RLS (restless legs syndrome)    Rosacea    Sleep apnea    SOB (shortness of breath)    Varicose veins of bilateral lower extremities with other complications    Vitamin D deficiency     Past Surgical History:  Procedure Laterality Date   CARPAL TUNNEL RELEASE Left    CESAREAN SECTION  1993   CHOLECYSTECTOMY  2016   COLONOSCOPY WITH PROPOFOL N/A 02/23/2023   Procedure: COLONOSCOPY WITH PROPOFOL;  Surgeon: Lanelle Bal, DO;  Location: [facility];  Service: Endoscopy;  Laterality: N/A;  1:15pm, asa 3   ENDOVENOUS ABLATION SAPHENOUS VEIN W/ LASER Left 08/11/2019   endovenous laser ablation left gretaer saphenous vein and stab phlebectomy 10-20 incisions left leg by Cari Caraway MD    ENDOVENOUS ABLATION SAPHENOUS VEIN W/ LASER Right 08/25/2019   endovenous laser ablation right greater saphenous vein by Cari Caraway MD    GASTRIC BYPASS  12/23/2011   LASIK      Social History   Socioeconomic History   Marital status: Divorced    Spouse name: Not on file   Number of children: Not on file   Years of education: Not on file   Highest education level: Associate degree: occupational, Scientist, product/process development, or vocational program  Occupational History   Not on file  Tobacco Use   Smoking status: Never   Smokeless tobacco: Never  Vaping Use   Vaping status: Never Used  Substance and Sexual Activity   Alcohol use: Not Currently    Comment: Glass of wine monthly    Drug use: Never   Sexual activity: Not Currently  Other Topics Concern   Not on file  Social History Narrative   Not on file   Social Drivers of Health   Financial Resource Strain: Low Risk  (09/01/2023)   Overall Financial Resource Strain (CARDIA)    Difficulty of Paying Living Expenses: Not very hard  Food Insecurity: No Food Insecurity (09/01/2023)   Hunger Vital Sign    Worried About Running Out of Food in the Last Year: Never true    Ran Out of Food in the Last Year: Never true  Transportation Needs: No Transportation Needs (09/01/2023)   PRAPARE - Administrator, Civil Service (Medical): No    Lack of Transportation (Non-Medical): No  Physical Activity: Insufficiently Active (09/01/2023)   Exercise Vital Sign    Days of Exercise per Week: 3 days    Minutes of Exercise per Session: 20  min  Stress: No Stress Concern Present (09/01/2023)   [employer] of Occupational Health - Occupational Stress Questionnaire    Feeling of Stress : Not at all  Social Connections: Moderately Integrated (09/01/2023)   Social Connection and Isolation Panel [NHANES]    Frequency of Communication with Friends and Family: More than three times a week    Frequency of Social Gatherings with Friends and Family: More than three times a week    Attends Religious Services: More than 4 times per year    Active Member of [employer] or Organizations: Yes    Attends Engineer, structural: More than 4 times per year    Marital Status: Divorced    Family History  Problem Relation Age of Onset   Alcohol abuse Mother    Cancer Mother    Cirrhosis Mother    Alcoholism Mother    Stroke Father    Hypertension Father    Heart disease Father    Breast cancer Neg Hx     Outpatient Encounter Medications as of 03/21/2024  Medication Sig   CALCIUM CITRATE PO Take 500 mg by mouth 2 (two) times daily.   Cholecalciferol (VITAMIN D3 PO) Take 1 tablet by mouth daily in the afternoon.    GLUCOSAMINE-CHONDROITIN PO Take 2 tablets by mouth daily.   metroNIDAZOLE (METROGEL) 0.75 % gel APPLY A THIN LAYER OF GEL TOPICALLY TWICE DAILY   Multiple Vitamins-Minerals (MULTIVITAMIN ADULTS PO) Take by mouth.   SYNTHROID 200 MCG tablet Take 1 tablet (200 mcg total) by mouth daily before breakfast.   SYNTHROID 50 MCG tablet Take 1 tablet (50 mcg total) by mouth daily before breakfast.   TURMERIC CURCUMIN PO Take 1 tablet by mouth daily.   Vitamin D, Ergocalciferol, (DRISDOL) 1.25 MG (50000 UNIT) CAPS capsule Take 1 capsule (50,000 Units total) by mouth every 7 (seven) days.   [DISCONTINUED] phentermine (ADIPEX-P) 37.5 MG tablet Take 1 tablet (37.5 mg total) by mouth daily before breakfast.   [DISCONTINUED] SYNTHROID 200 MCG tablet Take 1 tablet (200 mcg total) by mouth daily before breakfast.   [DISCONTINUED] SYNTHROID 50 MCG tablet Take 1 tablet (50 mcg total) by mouth daily before breakfast.   No facility-administered encounter medications on file as of 03/21/2024.    ALLERGIES: No Known Allergies  VACCINATION STATUS: Immunization History  Administered Date(s) Administered   Influenza, Seasonal, Injecte, Preservative Fre 09/08/2023   Influenza,inj,Quad PF,6+ Mos 10/14/2012, 10/29/2018, 08/30/2019, 11/05/2020, 10/09/2021, 10/21/2022   PFIZER(Purple Top)SARS-COV-2 Vaccination 03/15/2020, 04/07/2020, 11/02/2020, 09/20/2021   Tdap 05/02/2019   Zoster Recombinant(Shingrix) 01/06/2020, 03/19/2020   Zoster, Live 06/26/2013     HPI    Stacy Salazar  is a patient with the above medical history. she was diagnosed  with hypothyroidism at approximate age of 30 years. She was subsequently initiated on levothyroxine replacement.  she was given various doses of levothyroxine over the years.  During her last visit, her levothyroxine was adjusted at 250 mcg p.o. daily before breakfast.  She did not return for f/u since 10/2022.    She presents with significant weight gain, remains on same  dose of  Levothyroxine. She did have social stress in the interval. She has no new complaints today.   She has sleep apnea, using her CPAP less frequently than before.  She reports compliance to her medication.      Pt denies feeling nodules in neck, hoarseness, dysphagia/odynophagia, SOB with lying down.  she denies family history of  thyroid disorders, no family history of  thyroid malignancy.  No history of  radiation therapy to head or neck, no history of thyroid surgery.  I reviewed her chart and she also has a history of obesity, status post gastric bypass in 2013.  She also has sleep apnea, restless leg syndrome.   ROS: Limited as above.   Physical Exam: BP 132/88   Pulse 72   Ht 5\' 7"  (1.702 m)   Wt 283 lb 6.4 oz (128.5 kg)   BMI 44.39 kg/m  Wt Readings from Last 3 Encounters:  03/21/24 283 lb 6.4 oz (128.5 kg)  09/08/23 285 lb 6.4 oz (129.5 kg)  07/27/23 281 lb 12.8 oz (127.8 kg)      CMP ( most recent) CMP     Component Value Date/Time   NA 142 03/09/2024 0928   K 4.6 03/09/2024 0928   CL 104 03/09/2024 0928   CO2 25 03/09/2024 0928   GLUCOSE 97 03/09/2024 0928   GLUCOSE 87 02/20/2023 1249   BUN 14 03/09/2024 0928   CREATININE 0.57 03/09/2024 0928   CALCIUM 9.4 03/09/2024 0928   PROT 6.4 03/09/2024 0928   ALBUMIN 4.2 03/09/2024 0928   AST 18 03/09/2024 0928   ALT 16 03/09/2024 0928   ALKPHOS 82 03/09/2024 0928   BILITOT 0.2 03/09/2024 0928   GFRNONAA >60 02/20/2023 1249   GFRAA 117 07/12/2020 0947     Lipid Panel     Component Value Date/Time   CHOL 189 03/09/2024 0928   TRIG 143 03/09/2024 0928   HDL 60 03/09/2024 0928   CHOLHDL 3.2 03/09/2024 0928   LDLCALC 104 (H) 03/09/2024 0928   LABVLDL 25 03/09/2024 0928       Lab Results  Component Value Date   TSH 3.700 03/09/2024   TSH 9.790 (H) 04/16/2023   TSH 3.840 10/16/2022   TSH 0.827 07/14/2022   TSH 6.980 (H) 01/09/2022   TSH 4.210 06/27/2021   TSH 3.940 05/13/2021   TSH 12.44  (H) 11/05/2020   TSH 3.97 05/02/2020   TSH 1.50 01/02/2020   FREET4 1.14 03/09/2024   FREET4 1.12 04/16/2023   FREET4 1.27 10/16/2022   FREET4 1.49 07/14/2022   FREET4 1.08 01/09/2022   FREET4 1.37 05/13/2021   FREET4 1.1 11/05/2020   FREET4 1.3 05/02/2020   FREET4 1.6 01/02/2020       ASSESSMENT: 1. Hypothyroidism 2.  Vitamin D deficiency 3.  Obesity 4.  Dyslipidemia 5.  Prediabetes PLAN:    Patient with long-standing hypothyroidism.  Her previsit thyroid function tests are consistent with appropriate replacement.  She is advised to continue levothyroxine 250 mcg p.o. daily before breakfast.    - We discussed about the correct intake of her thyroid hormone, on empty stomach at fasting, with water, separated by at least 30 minutes from breakfast and other medications,  and separated by more than 4 hours from calcium, iron, multivitamins, acid reflux medications (PPIs). -Patient is made aware of the fact that thyroid hormone replacement is needed for life, dose to be adjusted by periodic monitoring of thyroid function tests.   -Due to absence of clinical goiter, no need for thyroid ultrasound.  Given her metabolic syndrome indicated by obesity, dyslipidemia, dysglycemia, sleep apnea, she was given brief lifestyle medicine package during her last visit, presents with continued significant improvement in her profile and was 42 pounds of weight loss.    - she acknowledges that there is a room for improvement in her food and drink choices. - Suggestion is made for her to  avoid simple carbohydrates  from her diet including Cakes, Sweet Desserts, Ice Cream, Soda (diet and regular), Sweet Tea, Candies, Chips, Cookies, Store Bought Juices, Alcohol , Artificial Sweeteners,  Coffee Creamer, and "Sugar-free" Products, Lemonade. This will help patient to have more stable blood glucose profile and potentially avoid unintended weight gain.  The following Lifestyle Medicine recommendations  according to American College of Lifestyle Medicine  [facility]) were discussed and and offered to patient and she  agrees to start the journey:  A. Whole Foods, Plant-Based Nutrition comprising of fruits and vegetables, plant-based proteins, whole-grain carbohydrates was discussed in detail with the patient.   A list for source of those nutrients were also provided to the patient.  Patient will use only water or unsweetened tea for hydration. B.  The need to stay away from risky substances including alcohol, smoking; obtaining 7 to 9 hours of restorative sleep, at least 150 minutes of moderate intensity exercise weekly, the importance of healthy social connections,  and stress management techniques were discussed. C.  A full color page of  Calorie density of various food groups per pound showing examples of each food groups was provided to the patient.  Her insurance will not provide coverage for [facility] nor Zepbound.  She is advised to continue vitamin D3 5000 units daily.   Her recent point-of-care A1c was 5.1%, improving from 5.7%, reversing prediabetes.     She is advised to maintain close follow-up with her PCP Jannifer Rodney, FNP.    I spent  26 minutes in the care of the patient today including review of labs from Thyroid Function, CMP, and other relevant labs ; imaging/biopsy records (current and previous including abstractions from other facilities); face-to-face time discussing  her lab results and symptoms, medications doses, her options of short and long term treatment based on the latest standards of care / guidelines;   and documenting the encounter.  Wayna Chalet  participated in the discussions, expressed understanding, and voiced agreement with the above plans.  All questions were answered to her satisfaction. she is encouraged to contact clinic should she have any questions or concerns prior to her return visit.     Return in about 6 months (around 09/20/2024) for F/U with  Pre-visit Labs, A1c -NV.  Marquis Lunch, MD [facility] [address] [place], [place] [address] Phone: [phone]  Fax: [phone]   03/21/2024, 6:08 PM  This note was partially dictated with voice recognition software. Similar sounding words can be transcribed inadequately or may not  be corrected upon review.

## 2024-06-23 ENCOUNTER — Telehealth: Payer: Self-pay

## 2024-06-23 NOTE — Telephone Encounter (Signed)
 Pt notified via Mychart and she did view the message. LS

## 2024-06-23 NOTE — Telephone Encounter (Signed)
 Copied from CRM [id number]. Topic: General - Other >> Jun 23, 2024 12:46 PM Carla L wrote: Reason for CRM: Patient requesting update on paperwork that she dropped off to be able to deliver plasma last Tuesday. Patient also sent mychart message on 06/14/24 and has not heard from anyone in regards to this.  Patient requesting call back, [phone]

## 2024-06-27 ENCOUNTER — Telehealth: Payer: Self-pay

## 2024-06-27 NOTE — Telephone Encounter (Signed)
 Yes, she can complete it.

## 2024-06-27 NOTE — Telephone Encounter (Signed)
 Called patient

## 2024-06-27 NOTE — Telephone Encounter (Signed)
 Copied from CRM [id number]. Topic: General - Other >> Jun 27, 2024 12:31 PM Elle L wrote: Reason for CRM: The patient states she picked up her annual plasma donation paperwork this morning. The patient states that her diabetic part is not filled out and she is requesting to see if she can fill it out or if she needs to bring it back to the office as the office is a 40 minute drive from her. She is requesting for NP Bari Learn' Nurse to call her. Her call back number is [phone].

## 2024-06-27 NOTE — Telephone Encounter (Signed)
 Patient aware and verbalized understanding.

## 2024-07-28 ENCOUNTER — Encounter: Payer: Self-pay | Admitting: Family

## 2024-07-28 ENCOUNTER — Ambulatory Visit: Admitting: Family

## 2024-07-28 VITALS — BP 131/79 | HR 60 | Temp 98.6°F | Ht 67.0 in | Wt 284.0 lb

## 2024-07-28 DIAGNOSIS — R7303 Prediabetes: Secondary | ICD-10-CM | POA: Diagnosis not present

## 2024-07-28 DIAGNOSIS — Z713 Dietary counseling and surveillance: Secondary | ICD-10-CM

## 2024-07-28 DIAGNOSIS — L719 Rosacea, unspecified: Secondary | ICD-10-CM

## 2024-07-28 DIAGNOSIS — D509 Iron deficiency anemia, unspecified: Secondary | ICD-10-CM | POA: Diagnosis not present

## 2024-07-28 DIAGNOSIS — Z0001 Encounter for general adult medical examination with abnormal findings: Secondary | ICD-10-CM | POA: Diagnosis not present

## 2024-07-28 DIAGNOSIS — Z9884 Bariatric surgery status: Secondary | ICD-10-CM | POA: Diagnosis not present

## 2024-07-28 DIAGNOSIS — Z0000 Encounter for general adult medical examination without abnormal findings: Secondary | ICD-10-CM

## 2024-07-28 DIAGNOSIS — E559 Vitamin D deficiency, unspecified: Secondary | ICD-10-CM

## 2024-07-28 DIAGNOSIS — E038 Other specified hypothyroidism: Secondary | ICD-10-CM

## 2024-07-28 DIAGNOSIS — E782 Mixed hyperlipidemia: Secondary | ICD-10-CM

## 2024-07-28 DIAGNOSIS — E538 Deficiency of other specified B group vitamins: Secondary | ICD-10-CM

## 2024-07-28 DIAGNOSIS — G4733 Obstructive sleep apnea (adult) (pediatric): Secondary | ICD-10-CM

## 2024-07-28 LAB — LIPID PANEL

## 2024-07-28 LAB — BAYER DCA HB A1C WAIVED: HB A1C (BAYER DCA - WAIVED): 5.1 % (ref 4.8–5.6)

## 2024-07-28 MED ORDER — PHENTERMINE HCL 37.5 MG PO TABS: 37.5000 mg | ORAL_TABLET | Freq: Every day | ORAL | 2 refills | Status: AC

## 2024-07-28 NOTE — Patient Instructions (Signed)

## 2024-07-28 NOTE — Progress Notes (Signed)
 Subjective:    Patient ID: Stacy Salazar, female    DOB: 11-04-1961, 63 y.o.   MRN: [id number]  Chief Complaint  Patient presents with   Annual Exam   Pt presents to the office today for CPE without pap.  She has hx of gastric bypass.    She has rosacea and uses metrogel  as needed.    Has OSA and uses CPAP nightly. Stable. She is morbid obese with a BMI of 44.   She was seen 09/08/23 for weight loss medications. She was started on phentermine , but states she only took it for a few weeks because her daughter had complications during pregnancy and was focusing on her daughter.     07/28/2024    3:18 PM 03/21/2024    3:31 PM 09/08/2023    1:46 PM  Last 3 Weights  Weight (lbs) 284 lb 283 lb 6.4 oz 285 lb 6.4 oz  Weight (kg) 128.822 kg 128.549 kg 129.457 kg     Thyroid  Problem Presents for follow-up visit. Patient reports no anxiety, constipation, diaphoresis, dry skin, fatigue or hoarse voice. The symptoms have been stable.  Anemia Presents for follow-up visit. There has been no malaise/fatigue.  Hyperlipidemia This is a chronic problem. The current episode started more than 1 year ago. The problem is uncontrolled. Recent lipid tests were reviewed and are high. Exacerbating diseases include obesity. Current antihyperlipidemic treatment includes diet change. The current treatment provides no improvement of lipids. Risk factors for coronary artery disease include dyslipidemia, a sedentary lifestyle, post-menopausal, obesity and hypertension.  Diabetes Diabetes type: prediabetic. Pertinent negatives for hypoglycemia include no nervousness/anxiousness. Pertinent negatives for diabetes include no blurred vision, no fatigue and no foot paresthesias. Risk factors for coronary artery disease include dyslipidemia, diabetes mellitus, hypertension, sedentary lifestyle and post-menopausal. (Does not check glucose)      Review of Systems  Constitutional:  Negative for diaphoresis, fatigue and  malaise/fatigue.  HENT:  Negative for hoarse voice.   Eyes:  Negative for blurred vision.  Gastrointestinal:  Negative for constipation.  Psychiatric/Behavioral:  The patient is not nervous/anxious.   All other systems reviewed and are negative.  Family History  Problem Relation Age of Onset   Alcohol abuse Mother    Cancer Mother    Cirrhosis Mother    Alcoholism Mother    Stroke Father    Hypertension Father    Heart disease Father    Diabetes Brother    Breast cancer Neg Hx    Social History   Socioeconomic History   Marital status: Divorced    Spouse name: Not on file   Number of children: Not on file   Years of education: Not on file   Highest education level: Associate degree: occupational, Scientist, product/process development, or vocational program  Occupational History   Not on file  Tobacco Use   Smoking status: Never   Smokeless tobacco: Never  Vaping Use   Vaping status: Never Used  Substance and Sexual Activity   Alcohol use: Yes    Alcohol/week: 2.0 standard drinks of alcohol    Types: 2 Glasses of wine per week    Comment: Glass of wine monthly   Drug use: Never   Sexual activity: Not Currently  Other Topics Concern   Not on file  Social History Narrative   Not on file   Social Drivers of Health   Financial Resource Strain: Low Risk  (07/24/2024)   Overall Financial Resource Strain (CARDIA)    Difficulty of Paying Living Expenses:  Not very hard  Food Insecurity: No Food Insecurity (07/24/2024)   Hunger Vital Sign    Worried About Running Out of Food in the Last Year: Never true    Ran Out of Food in the Last Year: Never true  Transportation Needs: No Transportation Needs (07/24/2024)   PRAPARE - Administrator, Civil Service (Medical): No    Lack of Transportation (Non-Medical): No  Physical Activity: Insufficiently Active (07/24/2024)   Exercise Vital Sign    Days of Exercise per Week: 4 days    Minutes of Exercise per Session: 30 min  Stress: No Stress Concern  Present (07/24/2024)   [employer] of Occupational Health - Occupational Stress Questionnaire    Feeling of Stress: Not at all  Social Connections: Moderately Integrated (07/24/2024)   Social Connection and Isolation Panel    Frequency of Communication with Friends and Family: More than three times a week    Frequency of Social Gatherings with Friends and Family: More than three times a week    Attends Religious Services: More than 4 times per year    Active Member of [employer] or Organizations: Yes    Attends Banker Meetings: 1 to 4 times per year    Marital Status: Divorced       Objective:   Physical Exam Vitals reviewed.  Constitutional:      General: She is not in acute distress.    Appearance: She is well-developed. She is obese.  HENT:     Head: Normocephalic and atraumatic.     Right Ear: Tympanic membrane normal.     Left Ear: Tympanic membrane normal.  Eyes:     Pupils: Pupils are equal, round, and reactive to light.  Neck:     Thyroid : No thyromegaly.  Cardiovascular:     Rate and Rhythm: Normal rate and regular rhythm.     Heart sounds: Normal heart sounds. No murmur heard. Pulmonary:     Effort: Pulmonary effort is normal. No respiratory distress.     Breath sounds: Normal breath sounds. No wheezing.  Abdominal:     General: Bowel sounds are normal. There is no distension.     Palpations: Abdomen is soft.     Tenderness: There is no abdominal tenderness.  Genitourinary:    General: Normal vulva.     Comments:   Musculoskeletal:        General: No tenderness. Normal range of motion.     Cervical back: Normal range of motion and neck supple.  Skin:    General: Skin is warm and dry.  Neurological:     Mental Status: She is alert and oriented to person, place, and time.     Cranial Nerves: No cranial nerve deficit.     Deep Tendon Reflexes: Reflexes are normal and symmetric.  Psychiatric:        Behavior: Behavior normal.        Thought  Content: Thought content normal.        Judgment: Judgment normal.       BP 131/79   Pulse 60   Temp 98.6 F (37 C)   Ht 5' 7 (1.702 m)   Wt 284 lb (128.8 kg)   SpO2 97%   BMI 44.48 kg/m      Assessment & Plan:  Kilani Joffe comes in today with chief complaint of Annual Exam   Diagnosis and orders addressed:  1. Annual physical exam (Primary) - Anemia Profile B - CMP14+EGFR -  Lipid panel - TSH - VITAMIN D  25 Hydroxy (Vit-D Deficiency, Fractures)  2. Other specified hypothyroidism - CMP14+EGFR - TSH  3. Iron deficiency anemia, unspecified iron deficiency anemia type - Anemia Profile B - CMP14+EGFR  4. Rosacea - CMP14+EGFR  5. Hx of gastric bypass - CMP14+EGFR  6. Morbid obesity (HCC) - CMP14+EGFR - phentermine  (ADIPEX-P ) 37.5 MG tablet; Take 1 tablet (37.5 mg total) by mouth daily before breakfast.  Dispense: 30 tablet; Refill: 2  7. OSA (obstructive sleep apnea) - CMP14+EGFR  8. Vitamin B 12 deficiency - Anemia Profile B - CMP14+EGFR  9. Vitamin D  deficiency - CMP14+EGFR - VITAMIN D  25 Hydroxy (Vit-D Deficiency, Fractures)  10. Mixed hyperlipidemia - CMP14+EGFR - Lipid panel  11. Prediabetes - CMP14+EGFR - Bayer DCA Hb A1c Waived  12. Weight loss counseling, encounter for - phentermine  (ADIPEX-P ) 37.5 MG tablet; Take 1 tablet (37.5 mg total) by mouth daily before breakfast.  Dispense: 30 tablet; Refill: 2    Labs pending Start phentermine   Encourage weight loss and exercise  Continue current medication  Health Maintenance reviewed Diet and exercise encouraged  Follow up plan: 3 months   Bari Learn, FNP

## 2024-07-29 ENCOUNTER — Ambulatory Visit: Payer: Self-pay | Admitting: Family

## 2024-07-29 LAB — CMP14+EGFR
ALT: 15 IU/L (ref 0–32)
AST: 17 IU/L (ref 0–40)
Albumin: 4.4 g/dL (ref 3.9–4.9)
Alkaline Phosphatase: 83 IU/L (ref 44–121)
BUN/Creatinine Ratio: 25 (ref 12–28)
BUN: 14 mg/dL (ref 8–27)
Bilirubin Total: 0.3 mg/dL (ref 0.0–1.2)
CO2: 22 mmol/L (ref 20–29)
Calcium: 9.4 mg/dL (ref 8.7–10.3)
Chloride: 104 mmol/L (ref 96–106)
Creatinine, Ser: 0.55 mg/dL — AB (ref 0.57–1.00)
Globulin, Total: 2.1 g/dL (ref 1.5–4.5)
Glucose: 85 mg/dL (ref 70–99)
Potassium: 4.4 mmol/L (ref 3.5–5.2)
Sodium: 142 mmol/L (ref 134–144)
Total Protein: 6.5 g/dL (ref 6.0–8.5)
eGFR: 103 mL/min/1.73 (ref >59)

## 2024-07-29 LAB — ANEMIA PROFILE B
Basophils Absolute: 0.1 x10E3/uL (ref 0.0–0.2)
Basos: 1 %
EOS (ABSOLUTE): 0.2 x10E3/uL (ref 0.0–0.4)
Eos: 3 %
Ferritin: 200 ng/mL — ABNORMAL HIGH (ref 15–150)
Folate: >20 ng/mL
Hematocrit: 39.6 % (ref 34.0–46.6)
Hemoglobin: 13.4 g/dL (ref 11.1–15.9)
Immature Grans (Abs): 0 x10E3/uL (ref 0.0–0.1)
Immature Granulocytes: 0 %
Iron Saturation: 23 % (ref 15–55)
Iron: 75 ug/dL (ref 27–139)
Lymphocytes Absolute: 2.2 x10E3/uL (ref 0.7–3.1)
Lymphs: 35 %
MCH: 31.6 pg (ref 26.6–33.0)
MCHC: 33.8 g/dL (ref 31.5–35.7)
MCV: 93 fL (ref 79–97)
Monocytes Absolute: 0.5 x10E3/uL (ref 0.1–0.9)
Monocytes: 7 %
Neutrophils Absolute: 3.4 x10E3/uL (ref 1.4–7.0)
Neutrophils: 54 %
Platelets: 273 x10E3/uL (ref 150–450)
RBC: 4.24 x10E6/uL (ref 3.77–5.28)
RDW: 12.3 % (ref 11.7–15.4)
Retic Ct Pct: 1.8 % (ref 0.6–2.6)
Total Iron Binding Capacity: 322 ug/dL (ref 250–450)
UIBC: 247 ug/dL (ref 118–369)
Vitamin B-12: 598 pg/mL (ref 232–1245)
WBC: 6.3 x10E3/uL (ref 3.4–10.8)

## 2024-07-29 LAB — LIPID PANEL
Chol/HDL Ratio: 3.4 ratio (ref 0.0–4.4)
Cholesterol, Total: 199 mg/dL (ref 100–199)
HDL: 59 mg/dL
LDL Chol Calc (NIH): 111 mg/dL — ABNORMAL HIGH (ref 0–99)
Triglycerides: 167 mg/dL — ABNORMAL HIGH (ref 0–149)
VLDL Cholesterol Cal: 29 mg/dL (ref 5–40)

## 2024-07-29 LAB — TSH: TSH: 4.12 u[IU]/mL (ref 0.450–4.500)

## 2024-07-29 LAB — VITAMIN D 25 HYDROXY (VIT D DEFICIENCY, FRACTURES): Vit D, 25-Hydroxy: 30.6 ng/mL (ref 30.0–100.0)

## 2024-09-20 ENCOUNTER — Ambulatory Visit: Admitting: "Endocrinology

## 2024-10-10 ENCOUNTER — Other Ambulatory Visit: Payer: Self-pay | Admitting: Family

## 2024-10-28 ENCOUNTER — Ambulatory Visit: Payer: Self-pay | Admitting: Family

## 2024-10-28 ENCOUNTER — Other Ambulatory Visit: Payer: Self-pay | Admitting: Family

## 2024-10-28 DIAGNOSIS — E039 Hypothyroidism, unspecified: Secondary | ICD-10-CM

## 2024-10-28 MED ORDER — SYNTHROID 50 MCG PO TABS: 50.0000 ug | ORAL_TABLET | Freq: Every day | ORAL | 1 refills | Status: AC

## 2024-10-28 MED ORDER — SYNTHROID 200 MCG PO TABS: 200.0000 ug | ORAL_TABLET | Freq: Every day | ORAL | 1 refills | Status: AC
